# Patient Record
Sex: Male | Born: 1965 | Race: Black or African American | Hispanic: No | Smoking: Current some day smoker
Health system: Southern US, Community
[De-identification: ages and names within clinical notes are randomized; demographics above are authoritative.]

## PROBLEM LIST (undated history)

## (undated) DIAGNOSIS — T8859XA Other complications of anesthesia, initial encounter: Secondary | ICD-10-CM

## (undated) DIAGNOSIS — I10 Essential (primary) hypertension: Secondary | ICD-10-CM

## (undated) DIAGNOSIS — I5022 Chronic systolic (congestive) heart failure: Secondary | ICD-10-CM

## (undated) DIAGNOSIS — Z8719 Personal history of other diseases of the digestive system: Secondary | ICD-10-CM

## (undated) DIAGNOSIS — T4145XA Adverse effect of unspecified anesthetic, initial encounter: Secondary | ICD-10-CM

## (undated) DIAGNOSIS — I2510 Atherosclerotic heart disease of native coronary artery without angina pectoris: Secondary | ICD-10-CM

## (undated) DIAGNOSIS — K219 Gastro-esophageal reflux disease without esophagitis: Secondary | ICD-10-CM

## (undated) DIAGNOSIS — I428 Other cardiomyopathies: Secondary | ICD-10-CM

## (undated) HISTORY — PX: CHOLECYSTECTOMY: SHX55

## (undated) HISTORY — PX: OTHER SURGICAL HISTORY: SHX169

---

## 2014-03-22 ENCOUNTER — Telehealth: Payer: Self-pay | Admitting: Internal Medicine

## 2014-03-22 ENCOUNTER — Emergency Department (HOSPITAL_BASED_OUTPATIENT_CLINIC_OR_DEPARTMENT_OTHER)
Admission: EM | Admit: 2014-03-22 | Discharge: 2014-03-22 | Disposition: A | Discharge status: Home / Self Care | Payer: Non-veteran care | Attending: Emergency Medicine | Admitting: Emergency Medicine

## 2014-03-22 ENCOUNTER — Encounter (HOSPITAL_BASED_OUTPATIENT_CLINIC_OR_DEPARTMENT_OTHER): Payer: Self-pay | Admitting: Emergency Medicine

## 2014-03-22 ENCOUNTER — Emergency Department (HOSPITAL_BASED_OUTPATIENT_CLINIC_OR_DEPARTMENT_OTHER): Payer: Non-veteran care

## 2014-03-22 DIAGNOSIS — I509 Heart failure, unspecified: Secondary | ICD-10-CM

## 2014-03-22 DIAGNOSIS — Z79899 Other long term (current) drug therapy: Secondary | ICD-10-CM | POA: Insufficient documentation

## 2014-03-22 DIAGNOSIS — F17200 Nicotine dependence, unspecified, uncomplicated: Secondary | ICD-10-CM | POA: Insufficient documentation

## 2014-03-22 DIAGNOSIS — I10 Essential (primary) hypertension: Secondary | ICD-10-CM | POA: Insufficient documentation

## 2014-03-22 DIAGNOSIS — Z8701 Personal history of pneumonia (recurrent): Secondary | ICD-10-CM | POA: Insufficient documentation

## 2014-03-22 DIAGNOSIS — R42 Dizziness and giddiness: Secondary | ICD-10-CM | POA: Insufficient documentation

## 2014-03-22 DIAGNOSIS — R000 Tachycardia, unspecified: Secondary | ICD-10-CM | POA: Insufficient documentation

## 2014-03-22 HISTORY — DX: Essential (primary) hypertension: I10

## 2014-03-22 LAB — CBC
HCT: 44.4 % (ref 39.0–52.0)
HEMOGLOBIN: 15.3 g/dL (ref 13.0–17.0)
MCH: 29.9 pg (ref 26.0–34.0)
MCHC: 34.5 g/dL (ref 30.0–36.0)
MCV: 86.7 fL (ref 78.0–100.0)
Platelets: 212 10*3/uL (ref 150–400)
RBC: 5.12 MIL/uL (ref 4.22–5.81)
RDW: 12.9 % (ref 11.5–15.5)
WBC: 8.9 10*3/uL (ref 4.0–10.5)

## 2014-03-22 LAB — COMPREHENSIVE METABOLIC PANEL
ALK PHOS: 58 U/L (ref 39–117)
ALT: 79 U/L — ABNORMAL HIGH (ref 0–53)
AST: 44 U/L — ABNORMAL HIGH (ref 0–37)
Albumin: 3.6 g/dL (ref 3.5–5.2)
BUN: 17 mg/dL (ref 6–23)
CO2: 22 mEq/L (ref 19–32)
Calcium: 9 mg/dL (ref 8.4–10.5)
Chloride: 104 mEq/L (ref 96–112)
Creatinine, Ser: 1.2 mg/dL (ref 0.50–1.35)
GFR, EST AFRICAN AMERICAN: 82 mL/min — AB (ref >90)
GFR, EST NON AFRICAN AMERICAN: 70 mL/min — AB (ref >90)
GLUCOSE: 111 mg/dL — AB (ref 70–99)
POTASSIUM: 4.4 meq/L (ref 3.7–5.3)
SODIUM: 141 meq/L (ref 137–147)
Total Bilirubin: 0.6 mg/dL (ref 0.3–1.2)
Total Protein: 6.4 g/dL (ref 6.0–8.3)

## 2014-03-22 LAB — PRO B NATRIURETIC PEPTIDE: Pro B Natriuretic peptide (BNP): 3661 pg/mL — ABNORMAL HIGH (ref 0–125)

## 2014-03-22 LAB — TROPONIN I: Troponin I: <0.3 ng/mL (ref <0.30)

## 2014-03-22 MED ORDER — FUROSEMIDE 10 MG/ML IJ SOLN
40.0000 mg | Freq: Once | INTRAMUSCULAR | Status: AC
  Administered 2014-03-22: 40 mg via INTRAVENOUS
  Filled 2014-03-22: qty 4

## 2014-03-22 MED ORDER — LISINOPRIL 20 MG PO TABS: 10.0000 mg | ORAL_TABLET | Freq: Every day | ORAL | Status: DC

## 2014-03-22 MED ORDER — FUROSEMIDE 40 MG PO TABS: 40.0000 mg | ORAL_TABLET | Freq: Every day | ORAL | Status: DC

## 2014-03-22 NOTE — ED Notes (Signed)
MD at bedside discussing test results and giving detailed dispo plan of care.

## 2014-03-22 NOTE — Telephone Encounter (Signed)
I was asked to assist with appointment scheduling and initial medications for a By dr. Marena Chancy from [facility].  I have not personally seen the patient however the story is that this is a 48 year old gentleman with a past medical history of hypertension, on antihypertensive medications in the past however has not been taking them for a while. Now presented with new onset of lower extremity edema. His workup was notable for elevated proBNP at 3660 as well as pulmonary congestion on his chest x-ray with minimal bilateral pleural effusions. Patient vital signs were stable he was noted to be hypertensive. Patient wanted to go home and expressed wishes not to be admitted for inpatient workup. Given that he was not excessively short of breath and wanted to be discharged from the emergency department. We decided to start him on: -Lisinopril 10 mg daily by mouth -Lasix 40 mg daily he'll. He will also get Lasix 40 mg IV in the emergency department. No beta blockers because his EF is unknown and he is volume overloaded. I will ask our scheduling an apartment to call the patient to set up an appointment for new onset of heart failure.

## 2014-03-22 NOTE — ED Provider Notes (Signed)
CSN: [id number]     Arrival date & time 03/22/14  2019 History  This chart was scribed for Dagmar HaitWilliam Rigby Leonhardt, MD by Joaquin MusicKristina Sanchez-Matthews, ED Scribe. This patient was seen in room MH10/MH10 and the patient's care was started at 8:40 PM.   Chief Complaint  Patient presents with  . Shortness of Breath   Patient is a 48 y.o. male presenting with shortness of breath. The history is provided by the patient. No language interpreter was used.  Shortness of Breath Severity:  Moderate Onset quality:  Sudden Duration:  1 week Timing:  Constant Progression:  Unchanged Chronicity:  New Context: not activity and not known allergens   Relieved by:  Nothing Worsened by:  Nothing tried Ineffective treatments:  None tried Associated symptoms: no abdominal pain, no chest pain, no vomiting and no wheezing    HPI Comments: Mark Horton is a 48 y.o. male with hx of HTN who presents to the Emergency Department complaining of ongoing SOB x 1 week. He reports having a productive cough with thick sputum for "a long time". Pt states he has become SOB, winded, and frequent sweating with light-headedness and states he normally is very active. He states he can become SOB when walking to the car or other short distances. Hx of pneumonia in December 2014. Pt states he noticed swelling to bilateral legs yesterday evening and states he has been concerned due to progressively worsening swelling to lower extremities. He states he has been "feeling swelling coming up his bilateral things". He states he is generally seen at the [facility]. Pt denies nausea, emesis, CP, abd pain, and any recent illnesses.  Pt denies taking his HTN medications for the past month.  Past Medical History  Diagnosis Date  . Hypertension    Past Surgical History  Procedure Laterality Date  . Cholecystectomy    . Joint replacement     History reviewed. No pertinent family history. History  Substance Use Topics  . Smoking status: Current Every  Day Smoker -- 0.50 packs/day    Types: Cigarettes  . Smokeless tobacco: Not on file  . Alcohol Use: 1.2 oz/week    2 Cans of beer per week    Review of Systems  Respiratory: Positive for shortness of breath. Negative for wheezing.   Cardiovascular: Negative for chest pain.  Gastrointestinal: Negative for nausea, vomiting and abdominal pain.  Neurological: Positive for light-headedness.  All other systems reviewed and are negative.  Allergies  Review of patient's allergies indicates no known allergies.  Home Medications   Prior to Admission medications   Medication Sig Start Date End Date Taking? Authorizing Provider  metoprolol tartrate (LOPRESSOR) 25 MG tablet Take 25 mg by mouth 2 (two) times daily.   Yes Historical Provider, MD   Triage Vitals:BP 163/112  Pulse 115  Temp(Src) 98.3 F (36.8 C)  Resp 16  Ht 5\' 10"  (1.778 m)  Wt 215 lb (97.523 kg)  BMI 30.85 kg/m2  SpO2 100%  Physical Exam  Nursing note and vitals reviewed. Constitutional: He is oriented to person, place, and time. He appears well-developed and well-nourished. No distress.  HENT:  Head: Normocephalic and atraumatic.  Eyes: EOM are normal.  Neck: Neck supple. No tracheal deviation present.  Cardiovascular: Normal rate, regular rhythm and normal heart sounds.  Exam reveals no gallop and no friction rub.   No murmur heard. Pulmonary/Chest: Effort normal and breath sounds normal. No respiratory distress.  Abdominal: He exhibits no mass. There is no rebound and no  guarding.  Mild epigastric tenderness.  Musculoskeletal: Normal range of motion. He exhibits edema.  2+ pitting edema up to bilateral knees. No erythema or cellulitis.   Neurological: He is alert and oriented to person, place, and time.  Skin: Skin is warm and dry.  Psychiatric: He has a normal mood and affect. His behavior is normal.   ED Course  Procedures  DIAGNOSTIC STUDIES: Oxygen Saturation is 100% on RA, normal by my interpretation.     COORDINATION OF CARE: 8:45 PM-Discussed treatment plan which includes CXR, EKG, and labs. Pt agreed to plan.   Labs Review Labs Reviewed  COMPREHENSIVE METABOLIC PANEL - Abnormal; Notable for the following:    Glucose, Bld 111 (*)    AST 44 (*)    ALT 79 (*)    GFR calc non Af Amer 70 (*)    GFR calc Af Amer 82 (*)    All other components within normal limits  PRO B NATRIURETIC PEPTIDE - Abnormal; Notable for the following:    Pro B Natriuretic peptide (BNP) 3661.0 (*)    All other components within normal limits  CBC  TROPONIN I    Imaging Review Dg Chest 2 View  03/22/2014   CLINICAL DATA:  Chest pain, shortness of breath  EXAM: CHEST  2 VIEW  COMPARISON:  DG CHEST 2V dated 12/02/2013  FINDINGS: Bilateral diffuse interstitial thickening and peribronchial cuffing most concerning for bronchitis. There is no focal parenchymal opacity, pleural effusion, or pneumothorax. The heart and mediastinal contours are unremarkable.  The osseous structures are unremarkable.  IMPRESSION: Bilateral diffuse interstitial thickening and peribronchial cuffing most concerning for bronchitis.   Electronically Signed   By: Elige Ko   On: 03/22/2014 21:18     EKG Interpretation None     MDM   Final diagnoses:  Heart failure    43M presents with symptoms of increased leg swelling, dyspnea on exertion, cough. No fevers. On exam, vitals with stable BP, mild tachycardia. On my exam, HR in the 90s. Relaxing comfortably, no respiratory distress. No JVD, no SOB. No rales. Belly benign. 2+ pitting edema in bilateral lower extremities. Labs show new onset heart failure with elevated BNP. Patient given Lasix IV. Patient doesn't want to be admitted. With stable vitals, this is reasonable. I spoke with the Cards fellow who recommended Lasix, lisinopril. Cards Fellow will put patient on the list to arrange f/u tomorrow morning. Patient given their number if he is not contacted. Patient aware of diagnosis,  understands he needs to take meds. Stable for discharge.  I personally performed the services described in this documentation, which was scribed in my presence. The recorded information has been reviewed and is accurate.  Dagmar Hait, MD 03/22/14 2239

## 2014-03-22 NOTE — Discharge Instructions (Signed)
Cardiology with call you with a follow-up appointment. Please follow up with them as soon as possible. Please take Lasix 40 mg once a day and Lisinopril 10 mg once a day for your heart failure.  Heart Failure Heart failure is a condition in which the heart has trouble pumping blood. This means your heart does not pump blood efficiently for your body to work well. In some cases of heart failure, fluid may back up into your lungs or you may have swelling (edema) in your lower legs. Heart failure is usually a long-term (chronic) condition. It is important for you to take good care of yourself and follow your caregiver's treatment plan. CAUSES  Some health conditions can cause heart failure. Those health conditions include:  High blood pressure (hypertension) causes the heart muscle to work harder than normal. When pressure in the blood vessels is high, the heart needs to pump (contract) with more force in order to circulate blood throughout the body. High blood pressure eventually causes the heart to become stiff and weak.  Coronary artery disease (CAD) is the buildup of cholesterol and fat (plaque) in the arteries of the heart. The blockage in the arteries deprives the heart muscle of oxygen and blood. This can cause chest pain and may lead to a heart attack. High blood pressure can also contribute to CAD.  Heart attack (myocardial infarction) occurs when 1 or more arteries in the heart become blocked. The loss of oxygen damages the muscle tissue of the heart. When this happens, part of the heart muscle dies. The injured tissue does not contract as well and weakens the heart's ability to pump blood.  Abnormal heart valves can cause heart failure when the heart valves do not open and close properly. This makes the heart muscle pump harder to keep the blood flowing.  Heart muscle disease (cardiomyopathy or myocarditis) is damage to the heart muscle from a variety of causes. These can include drug or  alcohol abuse, infections, or unknown reasons. These can increase the risk of heart failure.  Lung disease makes the heart work harder because the lungs do not work properly. This can cause a strain on the heart, leading it to fail.  Diabetes increases the risk of heart failure. High blood sugar contributes to high fat (lipid) levels in the blood. Diabetes can also cause slow damage to tiny blood vessels that carry important nutrients to the heart muscle. When the heart does not get enough oxygen and food, it can cause the heart to become weak and stiff. This leads to a heart that does not contract efficiently.  Other conditions can contribute to heart failure. These include abnormal heart rhythms, thyroid problems, and low blood counts (anemia). Certain unhealthy behaviors can increase the risk of heart failure. Those unhealthy behaviors include:  Being overweight.  Smoking or chewing tobacco.  Eating foods high in fat and cholesterol.  Abusing illicit drugs or alcohol.  Lacking physical activity. SYMPTOMS  Heart failure symptoms may vary and can be hard to detect. Symptoms may include:  Shortness of breath with activity, such as climbing stairs.  Persistent cough.  Swelling of the feet, ankles, legs, or abdomen.  Unexplained weight gain.  Difficulty breathing when lying flat (orthopnea).  Waking from sleep because of the need to sit up and get more air.  Rapid heartbeat.  Fatigue and loss of energy.  Feeling lightheaded, dizzy, or close to fainting.  Loss of appetite.  Nausea.  Increased urination during the night (nocturia).  DIAGNOSIS  A diagnosis of heart failure is based on your history, symptoms, physical examination, and diagnostic tests. Diagnostic tests for heart failure may include:  Echocardiography.  Electrocardiography.  Chest X-ray.  Blood tests.  Exercise stress test.  Cardiac angiography.  Radionuclide scans. TREATMENT  Treatment is aimed  at managing the symptoms of heart failure. Medicines, behavioral changes, or surgical intervention may be necessary to treat heart failure.  Medicines to help treat heart failure may include:  Angiotensin-converting enzyme (ACE) inhibitors. This type of medicine blocks the effects of a blood protein called angiotensin-converting enzyme. ACE inhibitors relax (dilate) the blood vessels and help lower blood pressure.  Angiotensin receptor blockers. This type of medicine blocks the actions of a blood protein called angiotensin. Angiotensin receptor blockers dilate the blood vessels and help lower blood pressure.  Water pills (diuretics). Diuretics cause the kidneys to remove salt and water from the blood. The extra fluid is removed through urination. This loss of extra fluid lowers the volume of blood the heart pumps.  Beta blockers. These prevent the heart from beating too fast and improve heart muscle strength.  Digitalis. This increases the force of the heartbeat.  Healthy behavior changes include:  Obtaining and maintaining a healthy weight.  Stopping smoking or chewing tobacco.  Eating heart healthy foods.  Limiting or avoiding alcohol.  Stopping illicit drug use.  Physical activity as directed by your caregiver.  Surgical treatment for heart failure may include:  A procedure to open blocked arteries, repair damaged heart valves, or remove damaged heart muscle tissue.  A pacemaker to improve heart muscle function and control certain abnormal heart rhythms.  An internal cardioverter defibrillator to treat certain serious abnormal heart rhythms.  A left ventricular assist device to assist the pumping ability of the heart. HOME CARE INSTRUCTIONS   Take your medicine as directed by your caregiver. Medicines are important in reducing the workload of your heart, slowing the progression of heart failure, and improving your symptoms.  Do not stop taking your medicine unless directed  by your caregiver.  Do not skip any dose of medicine.  Refill your prescriptions before you run out of medicine. Your medicines are needed every day.  Take over-the-counter medicine only as directed by your caregiver or pharmacist.  Engage in moderate physical activity if directed by your caregiver. Moderate physical activity can benefit some people. The elderly and people with severe heart failure should consult with a caregiver for physical activity recommendations.  Eat heart healthy foods. Food choices should be free of trans fat and low in saturated fat, cholesterol, and salt (sodium). Healthy choices include fresh or frozen fruits and vegetables, fish, lean meats, legumes, fat-free or low-fat dairy products, and whole grain or high fiber foods. Talk to a dietitian to learn more about heart healthy foods.  Limit sodium if directed by your caregiver. Sodium restriction may reduce symptoms of heart failure in some people. Talk to a dietitian to learn more about heart healthy seasonings.  Use healthy cooking methods. Healthy cooking methods include roasting, grilling, broiling, baking, poaching, steaming, or stir-frying. Talk to a dietitian to learn more about healthy cooking methods.  Limit fluids if directed by your caregiver. Fluid restriction may reduce symptoms of heart failure in some people.  Weigh yourself every day. Daily weights are important in the early recognition of excess fluid. You should weigh yourself every morning after you urinate and before you eat breakfast. Wear the same amount of clothing each time you weigh yourself.  Record your daily weight. Provide your caregiver with your weight record.  Monitor and record your blood pressure if directed by your caregiver.  Check your pulse if directed by your caregiver.  Lose weight if directed by your caregiver. Weight loss may reduce symptoms of heart failure in some people.  Stop smoking or chewing tobacco. Nicotine makes  your heart work harder by causing your blood vessels to constrict. Do not use nicotine gum or patches before talking to your caregiver.  Schedule and attend follow-up visits as directed by your caregiver. It is important to keep all your appointments.  Limit alcohol intake to no more than 1 drink per day for nonpregnant women and 2 drinks per day for men. Drinking more than that is harmful to your heart. Tell your caregiver if you drink alcohol several times a week. Talk with your caregiver about whether alcohol is safe for you. If your heart has already been damaged by alcohol or you have severe heart failure, drinking alcohol should be stopped completely.  Stop illicit drug use.  Stay up-to-date with immunizations. It is especially important to prevent respiratory infections through current pneumococcal and influenza immunizations.  Manage other health conditions such as hypertension, diabetes, thyroid disease, or abnormal heart rhythms as directed by your caregiver.  Learn to manage stress.  Plan rest periods when fatigued.  Learn strategies to manage high temperatures. If the weather is extremely hot:  Avoid vigorous physical activity.  Use air conditioning or fans or seek a cooler location.  Avoid caffeine and alcohol.  Wear loose-fitting, lightweight, and light-colored clothing.  Learn strategies to manage cold temperatures. If the weather is extremely cold:  Avoid vigorous physical activity.  Layer clothes.  Wear mittens or gloves, a hat, and a scarf when going outside.  Avoid alcohol.  Obtain ongoing education and support as needed.  Participate or seek rehabilitation as needed to maintain or improve independence and quality of life. SEEK MEDICAL CARE IF:   Your weight increases by 03 lb/1.4 kg in 1 day or 05 lb/2.3 kg in a week.  You have increasing shortness of breath that is unusual for you.  You are unable to participate in your usual physical  activities.  You tire easily.  You cough more than normal, especially with physical activity.  You have any or more swelling in areas such as your hands, feet, ankles, or abdomen.  You are unable to sleep because it is hard to breathe.  You feel like your heart is beating fast (palpitations).  You become dizzy or lightheaded upon standing up. SEEK IMMEDIATE MEDICAL CARE IF:   You have difficulty breathing.  There is a change in mental status such as decreased alertness or difficulty with concentration.  You have a pain or discomfort in your chest.  You have an episode of fainting (syncope). MAKE SURE YOU:   Understand these instructions.  Will watch your condition.  Will get help right away if you are not doing well or get worse. Document Released: 11/23/2005 Document Revised: 03/20/2013 Document Reviewed: 12/15/2012 [facility] Patient Information 2014 [place], [place].

## 2014-03-22 NOTE — ED Notes (Signed)
Pt c/o lower extr swelling and SOB x 1 week  W/o BP med x 1 month

## 2014-03-26 ENCOUNTER — Inpatient Hospital Stay (HOSPITAL_COMMUNITY)
Admission: AD | Admit: 2014-03-26 | Discharge: 2014-03-31 | DRG: 287 | Disposition: A | Discharge status: Home / Self Care | Payer: Non-veteran care | Source: Ambulatory Visit | Attending: Cardiology | Admitting: Cardiology

## 2014-03-26 ENCOUNTER — Encounter: Payer: Self-pay | Admitting: Cardiology

## 2014-03-26 ENCOUNTER — Ambulatory Visit (INDEPENDENT_AMBULATORY_CARE_PROVIDER_SITE_OTHER): Payer: Non-veteran care | Admitting: Cardiology

## 2014-03-26 ENCOUNTER — Encounter (HOSPITAL_COMMUNITY): Payer: Self-pay | Admitting: General Practice

## 2014-03-26 ENCOUNTER — Inpatient Hospital Stay (HOSPITAL_COMMUNITY): Payer: Non-veteran care

## 2014-03-26 VITALS — BP 138/104 | HR 103 | Ht 70.0 in | Wt 204.8 lb

## 2014-03-26 DIAGNOSIS — I472 Ventricular tachycardia, unspecified: Secondary | ICD-10-CM | POA: Diagnosis not present

## 2014-03-26 DIAGNOSIS — I2589 Other forms of chronic ischemic heart disease: Secondary | ICD-10-CM | POA: Diagnosis present

## 2014-03-26 DIAGNOSIS — I5022 Chronic systolic (congestive) heart failure: Secondary | ICD-10-CM | POA: Diagnosis present

## 2014-03-26 DIAGNOSIS — I5021 Acute systolic (congestive) heart failure: Secondary | ICD-10-CM

## 2014-03-26 DIAGNOSIS — Z8249 Family history of ischemic heart disease and other diseases of the circulatory system: Secondary | ICD-10-CM

## 2014-03-26 DIAGNOSIS — F17200 Nicotine dependence, unspecified, uncomplicated: Secondary | ICD-10-CM

## 2014-03-26 DIAGNOSIS — I110 Hypertensive heart disease with heart failure: Principal | ICD-10-CM | POA: Diagnosis present

## 2014-03-26 DIAGNOSIS — I119 Hypertensive heart disease without heart failure: Secondary | ICD-10-CM

## 2014-03-26 DIAGNOSIS — F1210 Cannabis abuse, uncomplicated: Secondary | ICD-10-CM | POA: Diagnosis present

## 2014-03-26 DIAGNOSIS — I509 Heart failure, unspecified: Secondary | ICD-10-CM

## 2014-03-26 DIAGNOSIS — Z720 Tobacco use: Secondary | ICD-10-CM | POA: Insufficient documentation

## 2014-03-26 DIAGNOSIS — I4729 Other ventricular tachycardia: Secondary | ICD-10-CM | POA: Diagnosis not present

## 2014-03-26 DIAGNOSIS — I059 Rheumatic mitral valve disease, unspecified: Secondary | ICD-10-CM

## 2014-03-26 DIAGNOSIS — I428 Other cardiomyopathies: Secondary | ICD-10-CM

## 2014-03-26 DIAGNOSIS — I10 Essential (primary) hypertension: Secondary | ICD-10-CM | POA: Insufficient documentation

## 2014-03-26 HISTORY — DX: Adverse effect of unspecified anesthetic, initial encounter: T41.45XA

## 2014-03-26 HISTORY — DX: Personal history of other diseases of the digestive system: Z87.19

## 2014-03-26 HISTORY — DX: Gastro-esophageal reflux disease without esophagitis: K21.9

## 2014-03-26 HISTORY — DX: Atherosclerotic heart disease of native coronary artery without angina pectoris: I25.10

## 2014-03-26 HISTORY — DX: Other complications of anesthesia, initial encounter: T88.59XA

## 2014-03-26 HISTORY — DX: Chronic systolic (congestive) heart failure: I50.22

## 2014-03-26 HISTORY — DX: Other cardiomyopathies: I42.8

## 2014-03-26 LAB — COMPREHENSIVE METABOLIC PANEL
ALT: 116 U/L — ABNORMAL HIGH (ref 0–53)
AST: 60 U/L — ABNORMAL HIGH (ref 0–37)
Albumin: 3.1 g/dL — ABNORMAL LOW (ref 3.5–5.2)
Alkaline Phosphatase: 89 U/L (ref 39–117)
BILIRUBIN TOTAL: 0.8 mg/dL (ref 0.3–1.2)
BUN: 18 mg/dL (ref 6–23)
CHLORIDE: 106 meq/L (ref 96–112)
CO2: 22 meq/L (ref 19–32)
CREATININE: 1.35 mg/dL (ref 0.50–1.35)
Calcium: 9 mg/dL (ref 8.4–10.5)
GFR calc Af Amer: 71 mL/min — ABNORMAL LOW (ref >90)
GFR, EST NON AFRICAN AMERICAN: 61 mL/min — AB (ref >90)
Glucose, Bld: 132 mg/dL — ABNORMAL HIGH (ref 70–99)
Potassium: 4.6 mEq/L (ref 3.7–5.3)
Sodium: 142 mEq/L (ref 137–147)
Total Protein: 6.3 g/dL (ref 6.0–8.3)

## 2014-03-26 LAB — CBC WITH DIFFERENTIAL/PLATELET
Basophils Absolute: 0 10*3/uL (ref 0.0–0.1)
Basophils Relative: 1 % (ref 0–1)
Eosinophils Absolute: 0.1 10*3/uL (ref 0.0–0.7)
Eosinophils Relative: 1 % (ref 0–5)
HCT: 46.2 % (ref 39.0–52.0)
Hemoglobin: 15.5 g/dL (ref 13.0–17.0)
LYMPHS ABS: 1.9 10*3/uL (ref 0.7–4.0)
Lymphocytes Relative: 25 % (ref 12–46)
MCH: 29.5 pg (ref 26.0–34.0)
MCHC: 33.5 g/dL (ref 30.0–36.0)
MCV: 88 fL (ref 78.0–100.0)
Monocytes Absolute: 0.5 10*3/uL (ref 0.1–1.0)
Monocytes Relative: 7 % (ref 3–12)
NEUTROS PCT: 66 % (ref 43–77)
Neutro Abs: 5 10*3/uL (ref 1.7–7.7)
Platelets: 227 10*3/uL (ref 150–400)
RBC: 5.25 MIL/uL (ref 4.22–5.81)
RDW: 13.1 % (ref 11.5–15.5)
WBC: 7.5 10*3/uL (ref 4.0–10.5)

## 2014-03-26 LAB — PROTIME-INR
INR: 1.3 (ref 0.00–1.49)
PROTHROMBIN TIME: 15.9 s — AB (ref 11.6–15.2)

## 2014-03-26 LAB — TROPONIN I: Troponin I: <0.3 ng/mL (ref <0.30)

## 2014-03-26 LAB — TSH: TSH: 2.71 u[IU]/mL (ref 0.350–4.500)

## 2014-03-26 LAB — PRO B NATRIURETIC PEPTIDE: Pro B Natriuretic peptide (BNP): 3960 pg/mL — ABNORMAL HIGH (ref 0–125)

## 2014-03-26 MED ORDER — LISINOPRIL 20 MG PO TABS
20.0000 mg | ORAL_TABLET | Freq: Every day | ORAL | Status: DC
  Administered 2014-03-26 – 2014-03-27 (×2): 20 mg via ORAL
  Filled 2014-03-26 (×2): qty 1

## 2014-03-26 MED ORDER — FUROSEMIDE 10 MG/ML IJ SOLN
40.0000 mg | Freq: Two times a day (BID) | INTRAMUSCULAR | Status: DC
Start: 2014-03-26 — End: 2014-03-28
  Administered 2014-03-26 – 2014-03-28 (×4): 40 mg via INTRAVENOUS
  Filled 2014-03-26 (×7): qty 4

## 2014-03-26 MED ORDER — SODIUM CHLORIDE 0.9 % IV SOLN: 250.0000 mL | INTRAVENOUS | Status: DC | PRN

## 2014-03-26 MED ORDER — ASPIRIN EC 81 MG PO TBEC
81.0000 mg | DELAYED_RELEASE_TABLET | Freq: Every day | ORAL | Status: DC
  Administered 2014-03-26 – 2014-03-31 (×5): 81 mg via ORAL
  Filled 2014-03-26 (×6): qty 1

## 2014-03-26 MED ORDER — FUROSEMIDE 10 MG/ML IJ SOLN
40.0000 mg | Freq: Once | INTRAMUSCULAR | Status: AC
  Administered 2014-03-26: 40 mg via INTRAVENOUS
  Filled 2014-03-26: qty 4

## 2014-03-26 MED ORDER — VITAMIN B-1 100 MG PO TABS
100.0000 mg | ORAL_TABLET | Freq: Every day | ORAL | Status: DC
  Administered 2014-03-26 – 2014-03-31 (×6): 100 mg via ORAL
  Filled 2014-03-26 (×6): qty 1

## 2014-03-26 MED ORDER — HYDRALAZINE HCL 20 MG/ML IJ SOLN
10.0000 mg | Freq: Four times a day (QID) | INTRAMUSCULAR | Status: DC | PRN
  Filled 2014-03-26: qty 1

## 2014-03-26 MED ORDER — FOLIC ACID 1 MG PO TABS
1.0000 mg | ORAL_TABLET | Freq: Every day | ORAL | Status: DC
  Administered 2014-03-26 – 2014-03-31 (×6): 1 mg via ORAL
  Filled 2014-03-26 (×6): qty 1

## 2014-03-26 MED ORDER — METOPROLOL TARTRATE 25 MG PO TABS
25.0000 mg | ORAL_TABLET | Freq: Two times a day (BID) | ORAL | Status: DC
  Administered 2014-03-26 – 2014-03-27 (×2): 25 mg via ORAL
  Filled 2014-03-26 (×3): qty 1

## 2014-03-26 MED ORDER — SODIUM CHLORIDE 0.9 % IJ SOLN
3.0000 mL | Freq: Two times a day (BID) | INTRAMUSCULAR | Status: DC
  Administered 2014-03-26 – 2014-03-30 (×8): 3 mL via INTRAVENOUS

## 2014-03-26 MED ORDER — LORAZEPAM 2 MG/ML IJ SOLN: 1.0000 mg | Freq: Four times a day (QID) | INTRAMUSCULAR | Status: AC | PRN

## 2014-03-26 MED ORDER — ACETAMINOPHEN 325 MG PO TABS
650.0000 mg | ORAL_TABLET | ORAL | Status: DC | PRN
  Administered 2014-03-26 – 2014-03-30 (×2): 650 mg via ORAL
  Filled 2014-03-26 (×2): qty 2

## 2014-03-26 MED ORDER — ONDANSETRON HCL 4 MG/2ML IJ SOLN
4.0000 mg | Freq: Four times a day (QID) | INTRAMUSCULAR | Status: DC | PRN
  Administered 2014-03-26 – 2014-03-30 (×9): 4 mg via INTRAVENOUS
  Filled 2014-03-26 (×9): qty 2

## 2014-03-26 MED ORDER — LORAZEPAM 1 MG PO TABS: 0.0000 mg | ORAL_TABLET | Freq: Four times a day (QID) | ORAL | Status: AC

## 2014-03-26 MED ORDER — SODIUM CHLORIDE 0.9 % IJ SOLN: 3.0000 mL | INTRAMUSCULAR | Status: DC | PRN

## 2014-03-26 MED ORDER — HEPARIN SODIUM (PORCINE) 5000 UNIT/ML IJ SOLN
5000.0000 [IU] | Freq: Three times a day (TID) | INTRAMUSCULAR | Status: DC
  Administered 2014-03-26 – 2014-03-31 (×13): 5000 [IU] via SUBCUTANEOUS
  Filled 2014-03-26 (×18): qty 1

## 2014-03-26 MED ORDER — LORAZEPAM 1 MG PO TABS: 0.0000 mg | ORAL_TABLET | Freq: Two times a day (BID) | ORAL | Status: AC

## 2014-03-26 MED ORDER — ADULT MULTIVITAMIN W/MINERALS CH
1.0000 | ORAL_TABLET | Freq: Every day | ORAL | Status: DC
  Administered 2014-03-26 – 2014-03-31 (×6): 1 via ORAL
  Filled 2014-03-26 (×6): qty 1

## 2014-03-26 MED ORDER — LORAZEPAM 1 MG PO TABS: 1.0000 mg | ORAL_TABLET | Freq: Four times a day (QID) | ORAL | Status: AC | PRN

## 2014-03-26 MED ORDER — THIAMINE HCL 100 MG/ML IJ SOLN
100.0000 mg | Freq: Every day | INTRAMUSCULAR | Status: DC
  Filled 2014-03-26 (×5): qty 1

## 2014-03-26 NOTE — Assessment & Plan Note (Signed)
Patient presents with acute congestive heart failure. His LV function is unknown. He is significantly volume overloaded and significantly symptomatic. Plan to admit. Begin IV diuresis with Lasix 40 mg IV twice a day. Follow renal function. Check echocardiogram for LV function. If LV function reduced he will need the addition of a beta blocker. Etiology of cardiomyopathy unclear but most likely hypertensive. He consumes 3 beers approximately 5 days per week. I have asked him to reduce this. Check TSH. He also describes chest pressure. If LV function is reduced he will need cardiac catheterization to exclude coronary disease. The risks and benefits were discussed and he agrees to proceed. Would plan after CHF improves.

## 2014-03-26 NOTE — Assessment & Plan Note (Signed)
Patient counseled on discontinuing. 

## 2014-03-26 NOTE — Assessment & Plan Note (Signed)
Blood pressure is elevated. Increase lisinopril to 20 mg daily. Diurese as outlined under congestive heart failure. Further titration of medications based on followup blood pressure.

## 2014-03-26 NOTE — H&P (Signed)
Mark Horton  03/26/2014 11:30 AM   Office Visit  MRN:  [id number]   Description: 48 year old male  Provider: Lewayne Bunting, MD  Department: [address] [facility]         Diagnoses    Acute systolic congestive heart failure    -  Primary    428.21,  428.0    Tobacco abuse        305.1    Hypertension        401.9      Reason for Visit    Congestive Heart Failure    New patient evaluation         Progress Notes    Lewayne Bunting, MD at 03/26/2014  8:30 AM    Status: Signed                   HPI: 48 year old male for evaluation of congestive heart failure. Seen recently in the emergency room with symptoms of CHF. Chest x-ray showed bilateral diffuse interstitial thickening and peribronchial cuffing most concerning for bronchitis. Hemoglobin 15.3. BUN 17 and creatinine 1.2. SGOT 44 SGPT 79. Pro BNP 3661. Patient was given IV Lasix. He declined to be admitted. He was asked to followup with cardiology. Patient states that he has had progressive dyspnea on exertion since December. There is orthopnea. He has developed bilateral lower extremity edema over the past one week. He has occasional chest pressure not related to exertion without radiation. He has had a nonproductive cough as well. No fevers, chills but there has been diaphoresis. Note he has hypertension for several years but has not taken his medications.    Current Outpatient Prescriptions   Medication  Sig  Dispense  Refill   .  furosemide (LASIX) 40 MG tablet  Take 1 tablet (40 mg total) by mouth daily.   30 tablet   0   .  lisinopril (PRINIVIL,ZESTRIL) 20 MG tablet  Take 0.5 tablets (10 mg total) by mouth daily.   30 tablet   0       No current facility-administered medications for this visit.        No Known Allergies  Past Medical History   Diagnosis  Date   .  Hypertension           Past Surgical History   Procedure  Laterality  Date   .  Cholecystectomy       .  Left knee surgery              History       Social History   .  Marital Status:  Legally Separated       Spouse Name:  N/A       Number of Children:  1   .  Years of Education:  N/A       Occupational History   .           Holiday representative       Social History Main Topics   .  Smoking status:  Current Every Day Smoker -- 0.50 packs/day       Types:  Cigarettes   .  Smokeless tobacco:  Not on file   .  Alcohol Use:  1.2 oz/week       2 Cans of beer per week   .  Drug Use:  Yes       Special:  Marijuana   .  Sexual Activity:  Not on file  Other Topics  Concern   .  Not on file       Social History Narrative   .  No narrative on file         Family History   Problem  Relation  Age of Onset   .  Heart disease  Father         Stent and CHF        ROS: Nonproductive cough And diaphoresis but no fevers or chills, hemoptysis, dysphasia, odynophagia, melena, hematochezia, dysuria, hematuria, rash, seizure activity, claudication. Remaining systems are negative.   Physical Exam:    Blood pressure 138/104, pulse 103, height 5\' 10"  (1.778 m), weight 204 lb 12.8 oz (92.897 kg), SpO2 97.00%.   General:  Well developed/well nourished in Mild distress. Diaphoretic. Skin warm/dry Patient not depressed No peripheral clubbing Back-normal HEENT-normal/normal eyelids Neck supple/normal carotid upstroke bilaterally; no bruits; no JVD; no thyromegaly chest - CTA/ normal expansion CV - RRR/normal S1 and S2; no murmurs;  Positive S4. Abdomen -NT/ND, no HSM, no mass, + bowel sounds, no bruit 2+ femoral pulses, no bruits Ext-2-3+ bilateral lower extremity edema, no chords, 2+ DP Neuro-grossly nonfocal   ECG Sinus rhythm at a rate of 103. Left atrial enlargement. Left ventricular hypertrophy. Inferior lateral T-wave inversion.              Acute systolic congestive heart failure - Lewayne BuntingBrian S Levonte Molina, MD at 03/26/2014 12:00 PM    Status: Written Related Problem: Acute systolic congestive  heart failure    Patient presents with acute congestive heart failure. His LV function is unknown. He is significantly volume overloaded and significantly symptomatic. Plan to admit. Begin IV diuresis with Lasix 40 mg IV twice a day. Follow renal function. Check echocardiogram for LV function. If LV function reduced he will need the addition of a beta blocker. Etiology of cardiomyopathy unclear but most likely hypertensive. He consumes 3 beers approximately 5 days per week. I have asked him to reduce this. Check TSH. He also describes chest pressure. If LV function is reduced he will need cardiac catheterization to exclude coronary disease. The risks and benefits were discussed and he agrees to proceed. Would plan after CHF improves.         Hypertension - Lewayne BuntingBrian S Elma Shands, MD at 03/26/2014 12:00 PM    Status: Written Related Problem: Hypertension    Blood pressure is elevated. Increase lisinopril to 20 mg daily. Diurese as outlined under congestive heart failure. Further titration of medications based on followup blood pressure.         Tobacco abuse - Lewayne BuntingBrian S Nekeya Briski, MD at 03/26/2014 12:00 PM    Status: Written Related Problem: Tobacco abuse    Patient counseled on discontinuing.

## 2014-03-26 NOTE — Progress Notes (Signed)
     HPI: 48 year old male for evaluation of congestive heart failure. Seen recently in the emergency room with symptoms of CHF. Chest x-ray showed bilateral diffuse interstitial thickening and peribronchial cuffing most concerning for bronchitis. Hemoglobin 15.3. BUN 17 and creatinine 1.2. SGOT 44 SGPT 79. Pro BNP 3661. Patient was given IV Lasix. He declined to be admitted. He was asked to followup with cardiology. Patient states that he has had progressive dyspnea on exertion since December. There is orthopnea. He has developed bilateral lower extremity edema over the past one week. He has occasional chest pressure not related to exertion without radiation. He has had a nonproductive cough as well. No fevers, chills but there has been diaphoresis. Note he has hypertension for several years but has not taken his medications.  Current Outpatient Prescriptions  Medication Sig Dispense Refill  . furosemide (LASIX) 40 MG tablet Take 1 tablet (40 mg total) by mouth daily.  30 tablet  0  . lisinopril (PRINIVIL,ZESTRIL) 20 MG tablet Take 0.5 tablets (10 mg total) by mouth daily.  30 tablet  0   No current facility-administered medications for this visit.    No Known Allergies  Past Medical History  Diagnosis Date  . Hypertension     Past Surgical History  Procedure Laterality Date  . Cholecystectomy    . Left knee surgery      History   Social History  . Marital Status: Legally Separated    Spouse Name: N/A    Number of Children: 1  . Years of Education: N/A   Occupational History  .      Holiday representative   Social History Main Topics  . Smoking status: Current Every Day Smoker -- 0.50 packs/day    Types: Cigarettes  . Smokeless tobacco: Not on file  . Alcohol Use: 1.2 oz/week    2 Cans of beer per week  . Drug Use: Yes    Special: Marijuana  . Sexual Activity: Not on file   Other Topics Concern  . Not on file   Social History Narrative  . No narrative on file    Family  History  Problem Relation Age of Onset  . Heart disease Father     Stent and CHF    ROS: Nonproductive cough And diaphoresis but no fevers or chills, hemoptysis, dysphasia, odynophagia, melena, hematochezia, dysuria, hematuria, rash, seizure activity, claudication. Remaining systems are negative.  Physical Exam:   Blood pressure 138/104, pulse 103, height 5\' 10"  (1.778 m), weight 204 lb 12.8 oz (92.897 kg), SpO2 97.00%.  General:  Well developed/well nourished in Mild distress. Diaphoretic. Skin warm/dry Patient not depressed No peripheral clubbing Back-normal HEENT-normal/normal eyelids Neck supple/normal carotid upstroke bilaterally; no bruits; no JVD; no thyromegaly chest - CTA/ normal expansion CV - RRR/normal S1 and S2; no murmurs;  Positive S4. Abdomen -NT/ND, no HSM, no mass, + bowel sounds, no bruit 2+ femoral pulses, no bruits Ext-2-3+ bilateral lower extremity edema, no chords, 2+ DP Neuro-grossly nonfocal  ECG Sinus rhythm at a rate of 103. Left atrial enlargement. Left ventricular hypertrophy. Inferior lateral T-wave inversion.

## 2014-03-26 NOTE — Progress Notes (Signed)
Pt with 9 beat run of VT; pt states he did feel some palpations that last for about 10-15 seconds; BP 149/117, HR ST 101; Luke Kilroy Pa paged and made aware; awaiting new orders to be received

## 2014-03-27 DIAGNOSIS — F17200 Nicotine dependence, unspecified, uncomplicated: Secondary | ICD-10-CM

## 2014-03-27 LAB — LIPID PANEL
Cholesterol: 111 mg/dL (ref 0–200)
HDL: 39 mg/dL — ABNORMAL LOW (ref >39)
LDL CALC: 56 mg/dL (ref 0–99)
TRIGLYCERIDES: 81 mg/dL (ref <150)
Total CHOL/HDL Ratio: 2.8 RATIO
VLDL: 16 mg/dL (ref 0–40)

## 2014-03-27 LAB — BASIC METABOLIC PANEL
BUN: 20 mg/dL (ref 6–23)
CO2: 24 mEq/L (ref 19–32)
Calcium: 8.5 mg/dL (ref 8.4–10.5)
Chloride: 102 mEq/L (ref 96–112)
Creatinine, Ser: 1.35 mg/dL (ref 0.50–1.35)
GFR calc Af Amer: 71 mL/min — ABNORMAL LOW (ref >90)
GFR, EST NON AFRICAN AMERICAN: 61 mL/min — AB (ref >90)
GLUCOSE: 106 mg/dL — AB (ref 70–99)
POTASSIUM: 3.9 meq/L (ref 3.7–5.3)
Sodium: 140 mEq/L (ref 137–147)

## 2014-03-27 LAB — TROPONIN I: Troponin I: <0.3 ng/mL (ref <0.30)

## 2014-03-27 LAB — MAGNESIUM: Magnesium: 2.3 mg/dL (ref 1.5–2.5)

## 2014-03-27 MED ORDER — LISINOPRIL 40 MG PO TABS
40.0000 mg | ORAL_TABLET | Freq: Every day | ORAL | Status: DC
  Administered 2014-03-28 – 2014-03-31 (×3): 40 mg via ORAL
  Filled 2014-03-27 (×4): qty 1

## 2014-03-27 MED ORDER — CARVEDILOL 6.25 MG PO TABS
6.2500 mg | ORAL_TABLET | Freq: Two times a day (BID) | ORAL | Status: DC
  Administered 2014-03-27 – 2014-03-31 (×8): 6.25 mg via ORAL
  Filled 2014-03-27 (×10): qty 1

## 2014-03-27 MED ORDER — ZOLPIDEM TARTRATE 5 MG PO TABS
5.0000 mg | ORAL_TABLET | Freq: Every evening | ORAL | Status: DC | PRN
  Administered 2014-03-27 – 2014-03-31 (×2): 5 mg via ORAL
  Filled 2014-03-27 (×2): qty 1

## 2014-03-27 MED ORDER — PANTOPRAZOLE SODIUM 40 MG PO TBEC
40.0000 mg | DELAYED_RELEASE_TABLET | Freq: Every day | ORAL | Status: DC
  Administered 2014-03-28 – 2014-03-31 (×4): 40 mg via ORAL
  Filled 2014-03-27 (×4): qty 1

## 2014-03-27 NOTE — Clinical Documentation Improvement (Signed)
  H&P states "Etiology of cardiomyopathy unclear but most likely hypertensive". Please clarify in Notes if  "Hypertensive Heart Disease" would be an appropriate secondary diagnosis. Thank you.   Thank You, Beverley Fiedler ,RN Clinical Documentation Specialist:  [phone]  [facility]- Health Information Management

## 2014-03-27 NOTE — Progress Notes (Signed)
Utilization review completed.  

## 2014-03-27 NOTE — Progress Notes (Signed)
Patient Name: Mark Horton Date of Encounter: 03/27/2014  Active Problems:   Acute systolic CHF (congestive heart failure)   Length of Stay: 1  SUBJECTIVE  Still orthopneic, dyspneic washing up No chest pain Good UO BP very high Hates metoprolol Echo suggests regional wall motion abnormalities and EF is moderately depressed Brief NSVT  CURRENT MEDS . aspirin EC  81 mg Oral Daily  . folic acid  1 mg Oral Daily  . furosemide  40 mg Intravenous BID  . heparin  5,000 Units Subcutaneous 3 times per day  . lisinopril  20 mg Oral Daily  . LORazepam  0-4 mg Oral Q6H   Followed by  . [START ON 03/28/2014] LORazepam  0-4 mg Oral Q12H  . metoprolol tartrate  25 mg Oral BID  . multivitamin with minerals  1 tablet Oral Daily  . sodium chloride  3 mL Intravenous Q12H  . thiamine  100 mg Oral Daily   Or  . thiamine  100 mg Intravenous Daily    OBJECTIVE   Intake/Output Summary (Last 24 hours) at 03/27/14 1203 Last data filed at 03/27/14 1100  Gross per 24 hour  Intake    243 ml  Output   3975 ml  Net  -3732 ml   Filed Weights   03/26/14 1530 03/27/14 0444  Weight: 200 lb 8 oz (90.946 kg) 201 lb 4.5 oz (91.3 kg)    PHYSICAL EXAM Filed Vitals:   03/26/14 1751 03/26/14 2040 03/27/14 0444 03/27/14 1007  BP: 152/103 119/87 140/105 142/101  Pulse: 101 84 85 90  Temp: 97.6 F (36.4 C) 97.7 F (36.5 C) 97.5 F (36.4 C)   TempSrc: Oral Oral Oral   Resp: 18 18 18    Height:      Weight:   201 lb 4.5 oz (91.3 kg)   SpO2: 98% 99% 100%    General: Alert, oriented x3, no distress Head: no evidence of trauma, PERRL, EOMI, no exophtalmos or lid lag, no myxedema, no xanthelasma; normal ears, nose and oropharynx Neck: normal jugular venous pulsations and no hepatojugular reflux; brisk carotid pulses without delay and no carotid bruits Chest: clear to auscultation, no signs of consolidation by percussion or palpation, normal fremitus, symmetrical and full respiratory  excursions Cardiovascular: normal position and quality of the apical impulse, regular rhythm, normal first and second heart sounds, no rubs or gallops, no murmur Abdomen: no tenderness or distention, no masses by palpation, no abnormal pulsatility or arterial bruits, normal bowel sounds, no hepatosplenomegaly Extremities: no clubbing, cyanosis or edema; 2+ radial, ulnar and brachial pulses bilaterally; 2+ right femoral, posterior tibial and dorsalis pedis pulses; 2+ left femoral, posterior tibial and dorsalis pedis pulses; no subclavian or femoral bruits Neurological: grossly nonfocal  LABS  CBC  Recent Labs  03/26/14 1422  WBC 7.5  NEUTROABS 5.0  HGB 15.5  HCT 46.2  MCV 88.0  PLT 227   Basic Metabolic Panel  Recent Labs  03/26/14 1422 03/27/14 0100  NA 142 140  K 4.6 3.9  CL 106 102  CO2 22 24  GLUCOSE 132* 106*  BUN 18 20  CREATININE 1.35 1.35  CALCIUM 9.0 8.5  MG  --  2.3   Liver Function Tests  Recent Labs  03/26/14 1422  AST 60*  ALT 116*  ALKPHOS 89  BILITOT 0.8  PROT 6.3  ALBUMIN 3.1*   No results found for this basename: LIPASE, AMYLASE,  in the last 72 hours Cardiac Enzymes  Recent Labs  03/26/14 1422  03/26/14 1953 03/27/14 0100  TROPONINI <0.30 <0.30 <0.30   BNP No components found with this basename: POCBNP,  D-Dimer No results found for this basename: DDIMER,  in the last 72 hours Hemoglobin A1C No results found for this basename: HGBA1C,  in the last 72 hours Fasting Lipid Panel  Recent Labs  03/27/14 0100  CHOL 111  HDL 39*  LDLCALC 56  TRIG 81  CHOLHDL 2.8   Thyroid Function Tests  Recent Labs  03/26/14 1422  TSH 2.710    Radiology Studies Imaging results have been reviewed and Dg Chest 2 View  03/26/2014   CLINICAL DATA:  New CHF.  History of hypertension.  Smoker.  EXAM: CHEST  2 VIEW  COMPARISON:  03/22/2014  FINDINGS: Mild cardiomegaly. No confluent opacities in the lungs. No effusions or edema. No acute bony  abnormality.  IMPRESSION: Cardiomegaly.  No active disease.   Electronically Signed   By: Charlett NoseKevin  Dover M.D.   On: 03/26/2014 17:26    TELE NSVT 9 beats  ECG Unavailable for review  ASSESSMENT AND PLAN  Moderate to severe cardiomyopathy, possibly ischemic CHF, acute systolic  Needs right and left heart cath, but not ready yet -cannot lie flat. Plan for Thursday. Continue diuretics. Increase ACEi. Switch to carvedilol. Discussed dfaily weight monitoring, sodium restriction, signs and symptoms of acute HF.   Thurmon FairMihai Lovelace Cerveny, MD, [facility] [phone] office [phone] pager 03/27/2014 12:03 PM

## 2014-03-28 LAB — BASIC METABOLIC PANEL
BUN: 17 mg/dL (ref 6–23)
CALCIUM: 8.8 mg/dL (ref 8.4–10.5)
CHLORIDE: 104 meq/L (ref 96–112)
CO2: 27 mEq/L (ref 19–32)
Creatinine, Ser: 1.38 mg/dL — ABNORMAL HIGH (ref 0.50–1.35)
GFR calc non Af Amer: 60 mL/min — ABNORMAL LOW (ref >90)
GFR, EST AFRICAN AMERICAN: 69 mL/min — AB (ref >90)
Glucose, Bld: 107 mg/dL — ABNORMAL HIGH (ref 70–99)
Potassium: 4.2 mEq/L (ref 3.7–5.3)
SODIUM: 142 meq/L (ref 137–147)

## 2014-03-28 MED ORDER — FUROSEMIDE 40 MG PO TABS
40.0000 mg | ORAL_TABLET | Freq: Once | ORAL | Status: AC
  Administered 2014-03-28: 40 mg via ORAL
  Filled 2014-03-28: qty 1

## 2014-03-28 NOTE — Progress Notes (Signed)
Subjective: BReathing better  Objective: Vital signs in last 24 hours: Temp:  [97.7 F (36.5 C)-98.1 F (36.7 C)] 97.7 F (36.5 C) (04/22 0500) Pulse Rate:  [83-94] 94 (04/22 0715) Resp:  [18] 18 (04/21 1345) BP: (125-154)/(94-124) 137/98 mmHg (04/22 1004) SpO2:  [98 %-100 %] 98 % (04/22 0500) Weight:  [194 lb (87.998 kg)] 194 lb (87.998 kg) (04/22 0500) Last BM Date: 03/27/14  Intake/Output from previous day: 04/21 0701 - 04/22 0700 In: 960 [P.O.:960] Out: 2425 [Urine:2425] Intake/Output this shift: Total I/O In: 240 [P.O.:240] Out: -   Medications Current Facility-Administered Medications  Medication Dose Route Frequency Provider Last Rate Last Dose  . 0.9 %  sodium chloride infusion  250 mL Intravenous PRN Dayna N Dunn, PA-C      . acetaminophen (TYLENOL) tablet 650 mg  650 mg Oral Q4H PRN Dayna N Dunn, PA-C   650 mg at 03/26/14 2145  . aspirin EC tablet 81 mg  81 mg Oral Daily Dayna N Dunn, PA-C   81 mg at 03/28/14 1004  . carvedilol (COREG) tablet 6.25 mg  6.25 mg Oral BID WC Noma Quijas, MD   6.25 mg at 03/28/14 0715  . folic acid (FOLVITE) tablet 1 mg  1 mg Oral Daily Dayna N Dunn, PA-C   1 mg at 03/28/14 1004  . furosemide (LASIX) injection 40 mg  40 mg Intravenous BID Dayna N Dunn, PA-C   40 mg at 03/28/14 0715  . heparin injection 5,000 Units  5,000 Units Subcutaneous 3 times per day Laurann Montana, PA-C   5,000 Units at 03/28/14 0549  . hydrALAZINE (APRESOLINE) injection 10 mg  10 mg Intravenous Q6H PRN Abelino Derrick, PA-C      . lisinopril (PRINIVIL,ZESTRIL) tablet 40 mg  40 mg Oral Daily Rio Kidane, MD   40 mg at 03/28/14 1004  . LORazepam (ATIVAN) tablet 1 mg  1 mg Oral Q6H PRN Dayna N Dunn, PA-C       Or  . LORazepam (ATIVAN) injection 1 mg  1 mg Intravenous Q6H PRN Dayna N Dunn, PA-C      . LORazepam (ATIVAN) tablet 0-4 mg  0-4 mg Oral Q6H Dayna N Dunn, PA-C       Followed by  . LORazepam (ATIVAN) tablet 0-4 mg  0-4 mg Oral Q12H Dayna N Dunn, PA-C       . multivitamin with minerals tablet 1 tablet  1 tablet Oral Daily Dayna N Dunn, PA-C   1 tablet at 03/28/14 1004  . ondansetron (ZOFRAN) injection 4 mg  4 mg Intravenous Q6H PRN Dayna N Dunn, PA-C   4 mg at 03/27/14 2307  . pantoprazole (PROTONIX) EC tablet 40 mg  40 mg Oral Q0600 Jaylee Freeze, MD   40 mg at 03/28/14 0550  . sodium chloride 0.9 % injection 3 mL  3 mL Intravenous Q12H Dayna N Dunn, PA-C   3 mL at 03/28/14 1005  . sodium chloride 0.9 % injection 3 mL  3 mL Intravenous PRN Dayna N Dunn, PA-C      . thiamine (VITAMIN B-1) tablet 100 mg  100 mg Oral Daily Dayna N Dunn, PA-C   100 mg at 03/28/14 1004   Or  . thiamine (B-1) injection 100 mg  100 mg Intravenous Daily Dayna N Dunn, PA-C      . zolpidem (AMBIEN) tablet 5 mg  5 mg Oral QHS PRN Thurmon Fair, MD   5 mg at 03/27/14 2307  PE: General appearance: alert, cooperative and no distress Neck: Mild JVD Lungs: clear to auscultation bilaterally Heart: regular rate and rhythm, S1, S2 normal, no murmur, click, rub or gallop Abdomen: +BS, nontender Extremities: 1+ right ankle edema.  Trace on the left.  Pulses: 2+ and symmetric Skin: Warm and dry Neurologic: Grossly normal  Lab Results:   Recent Labs  03/26/14 1422  WBC 7.5  HGB 15.5  HCT 46.2  PLT 227   BMET  Recent Labs  03/26/14 1422 03/27/14 0100 03/28/14 0450  NA 142 140 142  K 4.6 3.9 4.2  CL 106 102 104  CO2 22 24 27   GLUCOSE 132* 106* 107*  BUN 18 20 17   CREATININE 1.35 1.35 1.38*  CALCIUM 9.0 8.5 8.8   PT/INR  Recent Labs  03/26/14 1422  LABPROT 15.9*  INR 1.30   Cholesterol  Recent Labs  03/27/14 0100  CHOL 111     Assessment/Plan  48 year old male for evaluation of congestive heart failure. Seen recently in the emergency room with symptoms of CHF. Chest x-ray showed bilateral diffuse interstitial thickening and peribronchial cuffing most concerning for bronchitis. Hemoglobin 15.3. BUN 17 and creatinine 1.2. SGOT 44 SGPT 79.  Pro BNP 3661. Patient was given IV Lasix. He declined to be admitted. He was asked to followup with cardiology. Patient states that he has had progressive dyspnea on exertion since December. There is orthopnea. He has developed bilateral lower extremity edema over the past one week. He has occasional chest pressure not related to exertion without radiation. He has had a nonproductive cough as well. No fevers, chills but there has been diaphoresis. Note he has hypertension for several years but has not taken his medications.  Principal Problem:   Acute systolic CHF (congestive heart failure) Net fluids:  -1.5L/-4.4L.  Lasix 40mg  IV bid.  Lisinopril 40mg .  He looks pretty good.  Better than previously described.  He was just about flat in the bed when I came in.  He received his AM IV lasix this morning.  Will change evening dose to PO.   I wrote tonight's dose for "once" so we do not give an AM dose before the cath.  Restart tomorrow night.  Active Problems:   Hypertension  Coreg 6.25BID.  BP stable and a little better.    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  EF 30%  Right and left heart caths for tomorrow.    LOS: 2 days    Wilburt FinlayBryan Hager PA-C 03/28/2014 10:32 AM  I have seen and examined the patient along with Wilburt FinlayBryan Hager PA-C.  I have reviewed the chart, notes and new data.  I agree with PA's note.  Key new complaints: marked improvement in dyspnea, able to walk the hall, no longer orthopneic Key examination changes: JVP normal, can still hear an S3 when supine Key new findings / data: no real change in renal function  PLAN: Right and left heart cath tomorrow.  This procedure has been fully reviewed with the patient and informed consent has been obtained. Did not have a discussion re: possible need for AICD/Life Vest yet. Will defer until we know whether his cardiomyopathy is ischemic or not    Thurmon FairMihai Zaliyah Meikle, MD, [facility] [phone] 03/28/2014, 11:28 AM

## 2014-03-29 ENCOUNTER — Encounter (HOSPITAL_COMMUNITY)
Admission: AD | Disposition: A | Discharge status: Home / Self Care | Payer: Self-pay | Source: Ambulatory Visit | Attending: Cardiology

## 2014-03-29 DIAGNOSIS — I2510 Atherosclerotic heart disease of native coronary artery without angina pectoris: Secondary | ICD-10-CM

## 2014-03-29 HISTORY — PX: CORONARY ANGIOGRAM: SHX5786

## 2014-03-29 HISTORY — PX: LEFT AND RIGHT HEART CATHETERIZATION WITH CORONARY ANGIOGRAM: SHX5449

## 2014-03-29 LAB — POCT I-STAT 3, VENOUS BLOOD GAS (G3P V)
Acid-Base Excess: 1 mmol/L (ref 0.0–2.0)
BICARBONATE: 26.2 meq/L — AB (ref 20.0–24.0)
O2 Saturation: 67 %
PCO2 VEN: 41.6 mmHg — AB (ref 45.0–50.0)
PH VEN: 7.407 — AB (ref 7.250–7.300)
PO2 VEN: 35 mmHg (ref 30.0–45.0)
TCO2: 27 mmol/L (ref 0–100)

## 2014-03-29 LAB — CBC
HEMATOCRIT: 42.7 % (ref 39.0–52.0)
Hemoglobin: 14.5 g/dL (ref 13.0–17.0)
MCH: 30 pg (ref 26.0–34.0)
MCHC: 34 g/dL (ref 30.0–36.0)
MCV: 88.2 fL (ref 78.0–100.0)
Platelets: 210 10*3/uL (ref 150–400)
RBC: 4.84 MIL/uL (ref 4.22–5.81)
RDW: 13 % (ref 11.5–15.5)
WBC: 7.5 10*3/uL (ref 4.0–10.5)

## 2014-03-29 LAB — POCT I-STAT 3, ART BLOOD GAS (G3+)
Acid-Base Excess: 1 mmol/L (ref 0.0–2.0)
BICARBONATE: 25.8 meq/L — AB (ref 20.0–24.0)
O2 SAT: 86 %
TCO2: 27 mmol/L (ref 0–100)
pCO2 arterial: 42.1 mmHg (ref 35.0–45.0)
pH, Arterial: 7.395 (ref 7.350–7.450)
pO2, Arterial: 52 mmHg — ABNORMAL LOW (ref 80.0–100.0)

## 2014-03-29 LAB — BASIC METABOLIC PANEL
BUN: 17 mg/dL (ref 6–23)
CO2: 24 mEq/L (ref 19–32)
Calcium: 8.7 mg/dL (ref 8.4–10.5)
Chloride: 101 mEq/L (ref 96–112)
Creatinine, Ser: 1.46 mg/dL — ABNORMAL HIGH (ref 0.50–1.35)
GFR calc Af Amer: 64 mL/min — ABNORMAL LOW (ref >90)
GFR, EST NON AFRICAN AMERICAN: 56 mL/min — AB (ref >90)
Glucose, Bld: 112 mg/dL — ABNORMAL HIGH (ref 70–99)
POTASSIUM: 4 meq/L (ref 3.7–5.3)
SODIUM: 138 meq/L (ref 137–147)

## 2014-03-29 LAB — CREATININE, SERUM
Creatinine, Ser: 1.3 mg/dL (ref 0.50–1.35)
GFR calc Af Amer: 74 mL/min — ABNORMAL LOW (ref >90)
GFR calc non Af Amer: 64 mL/min — ABNORMAL LOW (ref >90)

## 2014-03-29 LAB — POCT ACTIVATED CLOTTING TIME: Activated Clotting Time: 193 seconds

## 2014-03-29 SURGERY — LEFT AND RIGHT HEART CATHETERIZATION WITH CORONARY ANGIOGRAM
Anesthesia: LOCAL

## 2014-03-29 MED ORDER — LIDOCAINE HCL (PF) 1 % IJ SOLN
INTRAMUSCULAR | Status: AC
  Filled 2014-03-29: qty 30

## 2014-03-29 MED ORDER — SODIUM CHLORIDE 0.9 % IJ SOLN: 3.0000 mL | INTRAMUSCULAR | Status: DC | PRN

## 2014-03-29 MED ORDER — SODIUM CHLORIDE 0.9 % IV SOLN: 250.0000 mL | INTRAVENOUS | Status: DC | PRN

## 2014-03-29 MED ORDER — ASPIRIN 81 MG PO CHEW
81.0000 mg | CHEWABLE_TABLET | ORAL | Status: AC
  Administered 2014-03-29: 81 mg via ORAL
  Filled 2014-03-29: qty 1

## 2014-03-29 MED ORDER — HEPARIN (PORCINE) IN NACL 2-0.9 UNIT/ML-% IJ SOLN
INTRAMUSCULAR | Status: AC
Start: 2014-03-29 — End: 2014-03-29
  Filled 2014-03-29: qty 1500

## 2014-03-29 MED ORDER — MIDAZOLAM HCL 2 MG/2ML IJ SOLN
INTRAMUSCULAR | Status: AC
  Filled 2014-03-29: qty 2

## 2014-03-29 MED ORDER — NITROGLYCERIN 0.2 MG/ML ON CALL CATH LAB
INTRAVENOUS | Status: AC
  Filled 2014-03-29: qty 1

## 2014-03-29 MED ORDER — SODIUM CHLORIDE 0.9 % IJ SOLN: 3.0000 mL | Freq: Two times a day (BID) | INTRAMUSCULAR | Status: DC

## 2014-03-29 MED ORDER — BISACODYL 5 MG PO TBEC
10.0000 mg | DELAYED_RELEASE_TABLET | Freq: Every day | ORAL | Status: DC | PRN
  Administered 2014-03-29: 10 mg via ORAL
  Filled 2014-03-29 (×2): qty 2

## 2014-03-29 MED ORDER — HEPARIN SODIUM (PORCINE) 1000 UNIT/ML IJ SOLN
INTRAMUSCULAR | Status: AC
  Filled 2014-03-29: qty 1

## 2014-03-29 MED ORDER — ISOSORB DINITRATE-HYDRALAZINE 20-37.5 MG PO TABS
1.0000 | ORAL_TABLET | Freq: Two times a day (BID) | ORAL | Status: DC
  Administered 2014-03-29 – 2014-03-31 (×4): 1 via ORAL
  Filled 2014-03-29 (×7): qty 1

## 2014-03-29 MED ORDER — SODIUM CHLORIDE 0.9 % IV SOLN
INTRAVENOUS | Status: DC
  Administered 2014-03-29: 09:00:00 via INTRAVENOUS

## 2014-03-29 MED ORDER — SODIUM CHLORIDE 0.9 % IV SOLN: 1.0000 mL/kg/h | INTRAVENOUS | Status: AC

## 2014-03-29 MED ORDER — SODIUM CHLORIDE 0.9 % IJ SOLN
3.0000 mL | Freq: Two times a day (BID) | INTRAMUSCULAR | Status: DC
  Administered 2014-03-31: 3 mL via INTRAVENOUS

## 2014-03-29 MED ORDER — VERAPAMIL HCL 2.5 MG/ML IV SOLN
INTRAVENOUS | Status: AC
Start: 2014-03-29 — End: 2014-03-29
  Filled 2014-03-29: qty 2

## 2014-03-29 MED ORDER — FENTANYL CITRATE 0.05 MG/ML IJ SOLN
INTRAMUSCULAR | Status: AC
Start: 2014-03-29 — End: 2014-03-29
  Filled 2014-03-29: qty 2

## 2014-03-29 NOTE — Interval H&P Note (Signed)
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 He mostly notes PND/Orthopnea, DOE & edema, but also noted intermittent Chest pain.  The various methods of treatment have been discussed with the patient and family. After consideration of risks, benefits and other options for treatment, the patient has consented to  Procedure(s): LEFT AND RIGHT HEART CATHETERIZATION WITH CORONARY ANGIOGRAM (N/A) as a surgical intervention .  The patient's history has been reviewed, patient examined, no change in status, stable for surgery.  I have reviewed the patient's chart and labs.  Questions were answered to the patient's satisfaction.     Mark Horton  Cath Lab Visit (complete for each Cath Lab visit)  Clinical Evaluation Leading to the Procedure:   ACS: no  Non-ACS:    Anginal Classification: CCS III  NYHA CHF III   Anti-ischemic medical therapy: Maximal Therapy (2 or more classes of medications)  Non-Invasive Test Results: Intermediate-risk stress test findings: cardiac mortality 1-3%/year; EF ~30% with regional WMA. New onset.  Prior CABG: No previous CABG

## 2014-03-29 NOTE — Progress Notes (Signed)
Subjective: Feels good.  Did not sleep well.   Objective: Vital signs in last 24 hours: Temp:  [98.1 F (36.7 C)-98.3 F (36.8 C)] 98.2 F (36.8 C) (04/23 0555) Pulse Rate:  [90-95] 90 (04/23 0555) Resp:  [18] 18 (04/23 0555) BP: (132-146)/(94-115) 132/94 mmHg (04/23 0555) SpO2:  [100 %] 100 % (04/23 0555) Weight:  [187 lb 14.4 oz (85.231 kg)] 187 lb 14.4 oz (85.231 kg) (04/23 0708) Last BM Date: 03/27/14  Intake/Output from previous day: 04/22 0701 - 04/23 0700 In: 240 [P.O.:240] Out: 500 [Urine:500] Intake/Output this shift:    Medications Current Facility-Administered Medications  Medication Dose Route Frequency Provider Last Rate Last Dose  . 0.9 %  sodium chloride infusion  250 mL Intravenous PRN Dayna N Dunn, PA-C      . acetaminophen (TYLENOL) tablet 650 mg  650 mg Oral Q4H PRN Dayna N Dunn, PA-C   650 mg at 03/26/14 2145  . aspirin EC tablet 81 mg  81 mg Oral Daily Dayna N Dunn, PA-C   81 mg at 03/28/14 1004  . carvedilol (COREG) tablet 6.25 mg  6.25 mg Oral BID WC Noal Abshier, MD   6.25 mg at 03/28/14 1714  . folic acid (FOLVITE) tablet 1 mg  1 mg Oral Daily Dayna N Dunn, PA-C   1 mg at 03/28/14 1004  . heparin injection 5,000 Units  5,000 Units Subcutaneous 3 times per day Laurann Montana, PA-C   5,000 Units at 03/29/14 0609  . hydrALAZINE (APRESOLINE) injection 10 mg  10 mg Intravenous Q6H PRN Eda Paschal Kilroy, PA-C      . lisinopril (PRINIVIL,ZESTRIL) tablet 40 mg  40 mg Oral Daily Mekiah Cambridge, MD   40 mg at 03/28/14 1004  . LORazepam (ATIVAN) tablet 1 mg  1 mg Oral Q6H PRN Dayna N Dunn, PA-C       Or  . LORazepam (ATIVAN) injection 1 mg  1 mg Intravenous Q6H PRN Dayna N Dunn, PA-C      . LORazepam (ATIVAN) tablet 0-4 mg  0-4 mg Oral Q12H Dayna N Dunn, PA-C      . multivitamin with minerals tablet 1 tablet  1 tablet Oral Daily Dayna N Dunn, PA-C   1 tablet at 03/28/14 1004  . ondansetron (ZOFRAN) injection 4 mg  4 mg Intravenous Q6H PRN Dayna N Dunn, PA-C   4  mg at 03/28/14 2200  . pantoprazole (PROTONIX) EC tablet 40 mg  40 mg Oral Q0600 Moshe Wenger, MD   40 mg at 03/29/14 0609  . sodium chloride 0.9 % injection 3 mL  3 mL Intravenous Q12H Dayna N Dunn, PA-C   3 mL at 03/28/14 1005  . sodium chloride 0.9 % injection 3 mL  3 mL Intravenous PRN Dayna N Dunn, PA-C      . thiamine (VITAMIN B-1) tablet 100 mg  100 mg Oral Daily Dayna N Dunn, PA-C   100 mg at 03/28/14 1004   Or  . thiamine (B-1) injection 100 mg  100 mg Intravenous Daily Dayna N Dunn, PA-C      . zolpidem (AMBIEN) tablet 5 mg  5 mg Oral QHS PRN Thurmon Fair, MD   5 mg at 03/27/14 2307    PE: General appearance: alert, cooperative and no distress  Neck: - JVD  Lungs: clear to auscultation bilaterally  Heart: regular rate and rhythm, S1, S2 normal, no murmur, click, rub or gallop  Abdomen: +BS, nontender  Extremities: No LEE Pulses: 2+  and symmetric  Skin: Warm and dry  Neurologic: Grossly normal   Lab Results:   Recent Labs  03/26/14 1422  WBC 7.5  HGB 15.5  HCT 46.2  PLT 227   BMET  Recent Labs  03/27/14 0100 03/28/14 0450 03/29/14 0524  NA 140 142 138  K 3.9 4.2 4.0  CL 102 104 101  CO2 24 27 24  GLUCOSE 106* 107* 112*  BUN 20 17 17  CREATININE 1.35 1.38* 1.46*  CALCIUM 8.5 8.8 8.7   PT/INR  Recent Labs  03/26/14 1422  LABPROT 15.9*  INR 1.30   Cholesterol  Recent Labs  03/27/14 0100  CHOL 111     Assessment/Plan Principal Problem:  Acute systolic CHF (congestive heart failure)  Net fluids: -0.3L/-4.7L. Lasix 40mg PO BID.  Held AM dose for cath.  Lisinopril 40mg.  He looks good. Lasix held this morning and will likely hold tonights dose given SCr has increased slightly.  I am going to give him 50ml/hr for 5 hours before caths.  Hold ACE and use PRN hydralazine if BP goes up.   Active Problems:  Hypertension  Coreg 6.25BID. BP stable and a little better.  Hold ACE and use PRN hydralazine if BP goes up.  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    LOS: 3 days    Bryan Hager PA-C 03/29/2014 8:14 AM  I have seen and examined the patient along with Bryan Hager, PA.  I have reviewed the chart, notes and new data.  I agree with PA's note.  Key new complaints: a little anxious about the bedrest after cath Key examination changes: normal LVP, no S3 Key new findings / data: creat up to 1.46  PLAN: R&L cardiac cath. This procedure has been fully reviewed with the patient and written informed consent has been obtained. If left heart filling pressures are not high at cath, increase carvedilol to 12.5 mg BID. Otherwise wait until follow up office appointment. Discuss AICD/Life Vest depending on cath findings. Raiza Kiesel, MD, FACC [facility] [phone] 03/29/2014, 10:47 AM  

## 2014-03-29 NOTE — H&P (View-Only) (Signed)
Subjective: Feels good.  Did not sleep well.   Objective: Vital signs in last 24 hours: Temp:  [98.1 F (36.7 C)-98.3 F (36.8 C)] 98.2 F (36.8 C) (04/23 0555) Pulse Rate:  [90-95] 90 (04/23 0555) Resp:  [18] 18 (04/23 0555) BP: (132-146)/(94-115) 132/94 mmHg (04/23 0555) SpO2:  [100 %] 100 % (04/23 0555) Weight:  [187 lb 14.4 oz (85.231 kg)] 187 lb 14.4 oz (85.231 kg) (04/23 0708) Last BM Date: 03/27/14  Intake/Output from previous day: 04/22 0701 - 04/23 0700 In: 240 [P.O.:240] Out: 500 [Urine:500] Intake/Output this shift:    Medications Current Facility-Administered Medications  Medication Dose Route Frequency Provider Last Rate Last Dose  . 0.9 %  sodium chloride infusion  250 mL Intravenous PRN Dayna N Dunn, PA-C      . acetaminophen (TYLENOL) tablet 650 mg  650 mg Oral Q4H PRN Dayna N Dunn, PA-C   650 mg at 03/26/14 2145  . aspirin EC tablet 81 mg  81 mg Oral Daily Dayna N Dunn, PA-C   81 mg at 03/28/14 1004  . carvedilol (COREG) tablet 6.25 mg  6.25 mg Oral BID WC Timberlynn Kizziah, MD   6.25 mg at 03/28/14 1714  . folic acid (FOLVITE) tablet 1 mg  1 mg Oral Daily Dayna N Dunn, PA-C   1 mg at 03/28/14 1004  . heparin injection 5,000 Units  5,000 Units Subcutaneous 3 times per day Laurann Montana, PA-C   5,000 Units at 03/29/14 0609  . hydrALAZINE (APRESOLINE) injection 10 mg  10 mg Intravenous Q6H PRN Eda Paschal Kilroy, PA-C      . lisinopril (PRINIVIL,ZESTRIL) tablet 40 mg  40 mg Oral Daily Jianni Shelden, MD   40 mg at 03/28/14 1004  . LORazepam (ATIVAN) tablet 1 mg  1 mg Oral Q6H PRN Dayna N Dunn, PA-C       Or  . LORazepam (ATIVAN) injection 1 mg  1 mg Intravenous Q6H PRN Dayna N Dunn, PA-C      . LORazepam (ATIVAN) tablet 0-4 mg  0-4 mg Oral Q12H Dayna N Dunn, PA-C      . multivitamin with minerals tablet 1 tablet  1 tablet Oral Daily Dayna N Dunn, PA-C   1 tablet at 03/28/14 1004  . ondansetron (ZOFRAN) injection 4 mg  4 mg Intravenous Q6H PRN Dayna N Dunn, PA-C   4  mg at 03/28/14 2200  . pantoprazole (PROTONIX) EC tablet 40 mg  40 mg Oral Q0600 Damarion Mendizabal, MD   40 mg at 03/29/14 0609  . sodium chloride 0.9 % injection 3 mL  3 mL Intravenous Q12H Dayna N Dunn, PA-C   3 mL at 03/28/14 1005  . sodium chloride 0.9 % injection 3 mL  3 mL Intravenous PRN Dayna N Dunn, PA-C      . thiamine (VITAMIN B-1) tablet 100 mg  100 mg Oral Daily Dayna N Dunn, PA-C   100 mg at 03/28/14 1004   Or  . thiamine (B-1) injection 100 mg  100 mg Intravenous Daily Dayna N Dunn, PA-C      . zolpidem (AMBIEN) tablet 5 mg  5 mg Oral QHS PRN Thurmon Fair, MD   5 mg at 03/27/14 2307    PE: General appearance: alert, cooperative and no distress  Neck: - JVD  Lungs: clear to auscultation bilaterally  Heart: regular rate and rhythm, S1, S2 normal, no murmur, click, rub or gallop  Abdomen: +BS, nontender  Extremities: No LEE Pulses: 2+  and symmetric  Skin: Warm and dry  Neurologic: Grossly normal   Lab Results:   Recent Labs  03/26/14 1422  WBC 7.5  HGB 15.5  HCT 46.2  PLT 227   BMET  Recent Labs  03/27/14 0100 03/28/14 0450 03/29/14 0524  NA 140 142 138  K 3.9 4.2 4.0  CL 102 104 101  CO2 24 27 24   GLUCOSE 106* 107* 112*  BUN 20 17 17   CREATININE 1.35 1.38* 1.46*  CALCIUM 8.5 8.8 8.7   PT/INR  Recent Labs  03/26/14 1422  LABPROT 15.9*  INR 1.30   Cholesterol  Recent Labs  03/27/14 0100  CHOL 111     Assessment/Plan Principal Problem:  Acute systolic CHF (congestive heart failure)  Net fluids: -0.3L/-4.7L. Lasix 40mg  PO BID.  Held AM dose for cath.  Lisinopril 40mg .  He looks good. Lasix held this morning and will likely hold tonights dose given SCr has increased slightly.  I am going to give him 7950ml/hr for 5 hours before caths.  Hold ACE and use PRN hydralazine if BP goes up.   Active Problems:  Hypertension  Coreg 6.25BID. BP stable and a little better.  Hold ACE and use PRN hydralazine if BP goes up.  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    LOS: 3 days    Wilburt FinlayBryan Hager PA-C 03/29/2014 8:14 AM  I have seen and examined the patient along with Wilburt FinlayBryan Hager, PA.  I have reviewed the chart, notes and new data.  I agree with PA's note.  Key new complaints: a little anxious about the bedrest after cath Key examination changes: normal LVP, no S3 Key new findings / data: creat up to 1.46  PLAN: R&L cardiac cath. This procedure has been fully reviewed with the patient and written informed consent has been obtained. If left heart filling pressures are not high at cath, increase carvedilol to 12.5 mg BID. Otherwise wait until follow up office appointment. Discuss AICD/Life Vest depending on cath findings. Thurmon FairMihai Eartha Vonbehren, MD, [facility] Center[facility]ACC [facility] [phone] 03/29/2014, 10:47 AM

## 2014-03-29 NOTE — CV Procedure (Signed)
CARDIAC CATHETERIZATION REPORT  NAME:  Rosser Collington   MRN: [id number] DOB:  07/22/1966   ADMIT DATE: 03/26/2014 Procedure Date: 03/29/2014  INTERVENTIONAL CARDIOLOGIST: Leonie Man, M.D., MS PRIMARY CARE PROVIDER: No primary provider on file. PRIMARY CARDIOLOGIST:  PATIENT:  Mark Horton is a 48 y.o. male with new diagnosis of cardiomyopathy of unknown etiology. EF roughly 30% with what appears to be inferior hypokinesis. He is not Harrel Lemon had anginal discomfort but has had some tightness in his chest associated with PND, orthopnea and edema as well as exertional dyspnea. He is referred for right heart catheterization to delineate his coronary anatomy and to determine the exttent of diuresis.  PRE-OPERATIVE DIAGNOSIS:    New diagnosis of cardiomyopathy, unclear etiology  PROCEDURES PERFORMED:    Right & Left Heart Catheterization With Coronary Angiography via Right Radial Artery and Right Brachial Vein  PROCEDURE:Consent:  Risks of procedure as well as the alternatives and risks of each were explained to the (patient/caregiver).  Consent for procedure obtained. Consent for signed by MD and patient with RN witness -- placed on chart.   PROCEDURE: The patient was brought to the [facility] in the fasting state and prepped and draped in the usual sterile fashion for Right groin or radial access. A modified Allen's test with plethysmography was performed, revealing excellent Ulnar artery collateral flow.  Sterile technique was used including antiseptics, cap, gloves, gown, hand hygiene, mask and sheet.  Skin prep: Chlorhexidine. A 20-gauge Right Antecubital Vein IV was started by the cath lab nurse staff using sterile technique and draped sterilely.  Time Out: Verified patient identification, verified procedure, site/side was marked, verified correct patient position, special equipment/implants available, medications/allergies/relevent history reviewed,  required imaging and test results available.  Performed  Access:   Right Radial Artery: 6 Fr Sheath - Seldinger Technique (Angiocath Micro-Puncture Kit)  4500 Units IV Heparin; 10 mL Radial Cocktail  Right Antecubital/Brachial Vein: The existing 20-gauge IV was exchanged over an eye doctor wire for a 6 French glide sheath.  Right Heart Catheterization: 5 Swan Ganz catheter advanced under fluoroscopy with balloon inflated to the RA, RV, then PCWP-PA for hemodynamic measurement.  Simultaneous FA & PA blood gases checked for SaO2% to calculate FICK CO/CI  Catheter removed completely out of the body with balloon deflated.  TR Band:  1515 Hours, 13 mL air  Hemodynamics:  SaO2%  Pressures mmHg  Mean P mmHg  EDP mmHg   Right Atrium    21/20   17    Right Ventricle    52/15    27   Pulmonary Artery   67   50/27   37    PCWP    33/37   33    Central Aortic   86   120/104   117    Left Ventricle    130/19   34          Cardiac Output:   Cardiac Index:    Fick   6.74   3.32     Left Ventriculography: Not performed to conserve contrast  Coronary Anatomy: Right dominant  Left Main: Large-caliber vessel that is very short/cloacal. Bifurcates into LAD and Circumflex. Angiographically normal. LAD: Large-caliber vessel with mild proximal calcification. There is a very proximal first diagonal branch and septal perforator followed by a roughly 40% tubular ossified lesion before the vessel somewhat normalizes wraps around the apex perfusing the inferoapex. There is a second diagonal branch in the  mid vessel is also free of disease. The remainder of the LAD systems is free of significant disease.  D1: Small-caliber very proximal vessel, almost ramus intermedius. Angiographic normal.  D2: Moderate caliber vessel the mid LAD. Angiographically normal.  Left Circumflex: Large caliber, nondominant that bifurcates in the mid vessel into OM 1 and the AV groove circumflex was bifurcates into LPL 1&2. Both  vessels are large in diameter and bifurcates into a smaller diameter branches. OM1 branches into a smaller superior branch and a larger inferior branch is very tortuous. LPL 1 and 2 are both moderate caliber.  Angiographically normal   RCA: Very large caliber, dominant vessel with a [place]'s [place] bend. There are 2 small moderate caliber marginal branches. The vessel bifurcates distally into small to moderate caliber right posterior descending artery (RPDA) Right Posterior AV Groove Branch (RPAV).  Minimal luminal irregularities.  MEDICATIONS:  Anesthesia:  Local Lidocaine 2 ml  Sedation:  1 mg IV Versed, 50 mcg IV fentanyl ;   Omnipaque Contrast: 70 ml  Anticoagulation:  IV Heparin 4500 Units ;  Radial Cocktail: 5 mg Verapamil, 400 mcg NTG, 2 ml 2% Lidocaine in 10 ml NS  PATIENT DISPOSITION:    The patient was transferred to the PACU holding area in a hemodynamicaly stable, chest pain free condition.  The patient tolerated the procedure well, and there were no complications.  EBL:   < 10 ml  The patient was stable before, during, and after the procedure.  POST-OPERATIVE DIAGNOSIS:    Non-ischemic Cardiomyopathy with mostly angiographically normal Coronary Arteries.  Severely Elevated LVEDP  Preserved CO & CI despite reduced LVEF  PLAN OF CARE:  Return to Nursing Unit for post radial/brachial catheterization care  Per d/w Dr. Sallyanne Kuster, will add Bidil & not titrate Coreg.  Will need continued diuresis   Results reviewed with Dr. Sallyanne Kuster.  Leonie Man, M.D., M.S. [facility] [address] [place], [place]  [address]  [phone]  03/29/2014 3:14 PM

## 2014-03-30 ENCOUNTER — Encounter (HOSPITAL_COMMUNITY): Payer: Self-pay | Admitting: Cardiology

## 2014-03-30 DIAGNOSIS — I428 Other cardiomyopathies: Secondary | ICD-10-CM | POA: Diagnosis present

## 2014-03-30 DIAGNOSIS — I119 Hypertensive heart disease without heart failure: Secondary | ICD-10-CM | POA: Diagnosis present

## 2014-03-30 MED ORDER — FUROSEMIDE 10 MG/ML IJ SOLN
40.0000 mg | Freq: Two times a day (BID) | INTRAMUSCULAR | Status: DC
  Administered 2014-03-30 – 2014-03-31 (×3): 40 mg via INTRAVENOUS
  Filled 2014-03-30 (×4): qty 4

## 2014-03-30 MED ORDER — FUROSEMIDE 10 MG/ML IJ SOLN: 40.0000 mg | Freq: Two times a day (BID) | INTRAMUSCULAR | Status: DC

## 2014-03-30 NOTE — Progress Notes (Addendum)
    Subjective:  No SOB, no history of syncope  Objective:  Vital Signs in the last 24 hours: Temp:  [97.3 F (36.3 C)-97.4 F (36.3 C)] 97.3 F (36.3 C) (04/24 0608) Pulse Rate:  [84-92] 84 (04/24 0608) Resp:  [16] 16 (04/24 0608) BP: (130-155)/(90-105) 140/92 mmHg (04/24 0608) SpO2:  [99 %] 99 % (04/24 0608)  Intake/Output from previous day:  Intake/Output Summary (Last 24 hours) at 03/30/14 0849 Last data filed at 03/29/14 1800  Gross per 24 hour  Intake    730 ml  Output      0 ml  Net    730 ml    Physical Exam: General appearance: alert, cooperative and no distress Lungs: clear to auscultation bilaterally Heart: regular rate and rhythm Extremities: Rt wrist without ecchymosis or hematoma   Rate: 86  Rhythm: normal sinus rhythm and no ectopy  Lab Results:  Recent Labs  03/29/14 1930  WBC 7.5  HGB 14.5  PLT 210    Recent Labs  03/28/14 0450 03/29/14 0524 03/29/14 1930  NA 142 138  --   K 4.2 4.0  --   CL 104 101  --   CO2 27 24  --   GLUCOSE 107* 112*  --   BUN 17 17  --   CREATININE 1.38* 1.46* 1.30   No results found for this basename: TROPONINI, CK, MB,  in the last 72 hours No results found for this basename: INR,  in the last 72 hours  Imaging: Imaging results have been reviewed  Cardiac Studies:  Assessment/Plan:   Principal Problem:   Acute systolic congestive heart failure Active Problems:   Non-ischemic cardiomyopathy- cath 03/29/14   Hypertension   Tobacco abuse Hypertensive heart disease   PLAN: ? -OK to discharge. He will need f/u BMP as an OP.   Corine Shelter PA-C Beeper [phone] 03/30/2014, 8:49 AM   I have seen and examined the patient along with Corine Shelter PA-C.  I have reviewed the chart, notes and new data.  I agree with PA's note.  He feels well this AM, lying flat. RHC showed he still has severely elevated filling pressures and at the end of the cath procedure he had symptom of pulmonary edema. Creatinine  lower.  PLAN: He is not yet at "dry weight" and needs additional diuresis to avoid readmission. IV diuretics today. Reevaluate for possible DC tomorrow. Premature to increase carvedilol.  Thurmon Fair, MD, [facility] [phone] 03/30/2014, 9:06 AM

## 2014-03-30 NOTE — Care Management Note (Signed)
    Page 1 of 1   03/30/2014     4:01:24 PM CARE MANAGEMENT NOTE 03/30/2014  Patient:  Mark Horton, Mark Horton   Account Number:  [id number]  Date Initiated:  03/30/2014  Documentation initiated by:  GRAVES-BIGELOW,Alecxis Baltzell  Subjective/Objective Assessment:   Pt admitted for CHF. Pt states he uses [address]     Action/Plan:   CM did call [facility] and left vm for FEE RN to make them aware that pt was in hospital. Pt will nott need HH services at d/c.   Anticipated DC Date:  03/31/2014   Anticipated DC Plan:  HOME/SELF CARE      DC Planning Services  CM consult      Choice offered to / List presented to:             Status of service:  Completed, signed off Medicare Important Message given?   (If response is "NO", the following Medicare IM given date fields will be blank) Date Medicare IM given:   Date Additional Medicare IM given:    Discharge Disposition:  HOME/SELF CARE  Per UR Regulation:  Reviewed for med. necessity/level of care/duration of stay  If discussed at Long Length of Stay Meetings, dates discussed:    Comments:

## 2014-03-30 NOTE — Progress Notes (Signed)
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 Anticipated return to work May 4th. He will need a note at discharge.   Corine Shelter PA-C 03/30/2014 9:11 AM

## 2014-03-31 ENCOUNTER — Encounter (HOSPITAL_COMMUNITY): Payer: Self-pay | Admitting: Nurse Practitioner

## 2014-03-31 LAB — BASIC METABOLIC PANEL
BUN: 16 mg/dL (ref 6–23)
CO2: 24 mEq/L (ref 19–32)
Calcium: 8.6 mg/dL (ref 8.4–10.5)
Chloride: 103 mEq/L (ref 96–112)
Creatinine, Ser: 1.3 mg/dL (ref 0.50–1.35)
GFR calc Af Amer: 74 mL/min — ABNORMAL LOW (ref >90)
GFR calc non Af Amer: 64 mL/min — ABNORMAL LOW (ref >90)
Glucose, Bld: 105 mg/dL — ABNORMAL HIGH (ref 70–99)
Potassium: 3.9 mEq/L (ref 3.7–5.3)
Sodium: 140 mEq/L (ref 137–147)

## 2014-03-31 MED ORDER — LISINOPRIL 40 MG PO TABS: 40.0000 mg | ORAL_TABLET | Freq: Every day | ORAL | Status: DC

## 2014-03-31 MED ORDER — ASPIRIN 81 MG PO TBEC: 81.0000 mg | DELAYED_RELEASE_TABLET | Freq: Every day | ORAL | Status: AC

## 2014-03-31 MED ORDER — CARVEDILOL 6.25 MG PO TABS: 6.2500 mg | ORAL_TABLET | Freq: Two times a day (BID) | ORAL | Status: DC

## 2014-03-31 MED ORDER — ISOSORB DINITRATE-HYDRALAZINE 20-37.5 MG PO TABS: 1.0000 | ORAL_TABLET | Freq: Two times a day (BID) | ORAL | Status: AC

## 2014-03-31 MED ORDER — FUROSEMIDE 40 MG PO TABS: 40.0000 mg | ORAL_TABLET | Freq: Two times a day (BID) | ORAL | Status: DC

## 2014-03-31 MED ORDER — ATORVASTATIN CALCIUM 10 MG PO TABS: 10.0000 mg | ORAL_TABLET | Freq: Every day | ORAL | Status: DC

## 2014-03-31 NOTE — Discharge Summary (Signed)
Discharge Summary   Patient ID: Mark Horton,  MRN: [id number], DOB/AGE: [birth year] 48 y.o.  Admit date: 03/26/2014 Discharge date: 03/31/2014  Primary Care Provider: [facility] Primary Cardiologist: B. Jens Som, MD   Discharge Diagnoses Principal Problem:   Acute systolic congestive heart failure  **Net negative diuresis of 6.8 Liters this admission with reduction in weight from 200 lbs to 186 lbs on discharge.  Active Problems:   Non-ischemic cardiomyopathy  ** EF 30% by echo this admission.  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   Tobacco abuse   Hypertensive heart disease  Allergies No Known Allergies  Procedures  2D Echocardiogram 4.20.2015  Study Conclusions  - Left ventricle: LVEF is 30% with diffuse hypokinesis,   worse in the inferior, inferoseptal and apical walls. The   cavity size was severely dilated. Wall thickness was   normal. - Pulmonary arteries: PA peak pressure: 75mm Hg (S). _____________   Cardiac Catheterization 4.23.2015  Hemodynamics:   SaO2%   Pressures mmHg   Mean P mmHg   EDP mmHg    Right Atrium      21/20    17      Right Ventricle      52/15      27    Pulmonary Artery    67    50/27    37      PCWP      33/37    33      Central Aortic    86    120/104    117      Left Ventricle      130/19     34                 Cardiac Output:     Cardiac Index:      Fick    6.74     3.32      Left Ventriculography: Not performed to conserve contrast  Coronary Anatomy: Right dominant  Left Main: Large-caliber vessel that is very short/cloacal. Bifurcates into LAD and Circumflex. Angiographically normal. LAD: Large-caliber vessel with mild proximal calcification. There is a very proximal first diagonal branch and septal perforator followed by a roughly 40% tubular ossified lesion before the vessel somewhat normalizes wraps around the apex perfusing the inferoapex. There is a second diagonal branch in the mid vessel  is also free of disease. The remainder of the LAD systems is free of significant disease.  D1: Small-caliber very proximal vessel, almost ramus intermedius. Angiographic normal.  D2: Moderate caliber vessel the mid LAD. Angiographically normal.  Left Circumflex: Large caliber, nondominant that bifurcates in the mid vessel into OM 1 and the AV groove circumflex was bifurcates into LPL 1&2. Both vessels are large in diameter and bifurcates into a smaller diameter branches. OM1 branches into a smaller superior branch and a larger inferior branch is very tortuous. LPL 1 and 2 are both moderate caliber.  Angiographically normal     RCA: Very large caliber, dominant vessel with a [place] bend. There are 2 small moderate caliber marginal branches. The vessel bifurcates distally into small to moderate caliber right posterior descending artery (RPDA) Right Posterior AV Groove Branch (RPAV).  Minimal luminal irregularities. _____________  History of Present Illness  48 y/o male with a h/o poorly controlled hypertension.  He was recently seen in the emergency department with progressive dyspnea and orthopnea.  His CXR showed bilateral diffuse interstitial thickening and periobronchial cuffing most concerning for bronchitits.  Pro BNP was elevated @ 3661 and LFT's were mildly elevated.  He was felt to be volume overloaded and was treated with IV lasix.  Admission was recommended however pt refused and he was thus advised to follow-up with cardiology.  He was seen in the office on 4/20 and continued to exhibit volume overload with complaints of dyspnea and orthopnea.  Decision was made to admit him for further evaluation.  Hospital Course  Following admission, Mr. Siddoway was placed on IV lasix with symptomatic improvement.  A 2D echocardiogram was performed on 4/20, revealing an EF of 30% with multiple wall motion abnormalities.  Given this finding, it was felt that he would require a right and left  heart cardiac catheterization to r/o obstructive CAD and assess his filling pressures.  He was maintained on IV lasix with symptomatic improvement and on April 23, he underwent diagnostic cardiac catheterization revealing nonobstructive coronary artery disease, normal cardiac output/index, and persistently elevated filling pressures with a pulmonary capillary wedge pressure of 33 mm mercury. It was felt that he would require further diuresis. In addition, his medications were optimized and he was placed on carvedilol, BiDil, and ACE inhibitor therapy. His renal function has remained stable with diuresis.   For the entire admission, he is -6.8 L and weight has been reduced from 200 pounds on admission to 186 pounds on discharge.  He has been ambulating without symptoms or limitations and has been counseled on the importance of medication adherence, dietary sodium restriction, daily weights, and symptom reporting. He has also been counseled on the importance of limiting alcohol intake and smoking cessation. He will be discharged home today in good condition and we will arrange for followup within 7 days in the office at which time we will also repeat a basic metabolic profile.   Discharge Vitals Blood pressure 144/96, pulse 96, temperature 98.1 F (36.7 C), temperature source Oral, resp. rate 18, height 5\' 10"  (1.778 m), weight 186 lb 8 oz (84.596 kg), SpO2 98.00%.  Filed Weights   03/28/14 0500 03/29/14 0708 03/31/14 0457  Weight: 194 lb (87.998 kg) 187 lb 14.4 oz (85.231 kg) 186 lb 8 oz (84.596 kg)   Labs  CBC  Recent Labs  03/29/14 1930  WBC 7.5  HGB 14.5  HCT 42.7  MCV 88.2  PLT 210   Basic Metabolic Panel  Recent Labs  03/29/14 0524 03/29/14 1930 03/31/14 0610  NA 138  --  140  K 4.0  --  3.9  CL 101  --  103  CO2 24  --  24  GLUCOSE 112*  --  105*  BUN 17  --  16  CREATININE 1.46* 1.30 1.30  CALCIUM 8.7  --  8.6   Liver Function Tests Lab Results  Component Value Date    ALT 116* 03/26/2014   AST 60* 03/26/2014   ALKPHOS 89 03/26/2014   BILITOT 0.8 03/26/2014    Cardiac Enzymes Lab Results  Component Value Date   TROPONINI <0.30 03/27/2014    Fasting Lipid Panel Lab Results  Component Value Date   CHOL 111 03/27/2014   HDL 39* 03/27/2014   LDLCALC 56 03/27/2014   TRIG 81 03/27/2014   CHOLHDL 2.8 03/27/2014    Thyroid Function Tests Lab Results  Component Value Date   TSH 2.710 03/26/2014    Disposition  Pt is being discharged home today in good condition.  Follow-up Plans & Appointments  Follow-up Information   Follow up with Olga Millers, MD. (we  will arrange for f/u and contact you this week.)    Specialty:  Cardiology   Contact information:   [address] [place] [address] [phone]       [facility] with [facility]. (as scheduled.)      Discharge Medications    Medication List         aspirin 81 MG EC tablet  Take 1 tablet (81 mg total) by mouth daily.     atorvastatin 10 MG tablet  Commonly known as:  LIPITOR  Take 1 tablet (10 mg total) by mouth daily.     carvedilol 6.25 MG tablet  Commonly known as:  COREG  Take 1 tablet (6.25 mg total) by mouth 2 (two) times daily with a meal.     furosemide 40 MG tablet  Commonly known as:  LASIX  Take 1 tablet (40 mg total) by mouth 2 (two) times daily.     isosorbide-hydrALAZINE 20-37.5 MG per tablet  Commonly known as:  BIDIL  Take 1 tablet by mouth 2 (two) times daily.     lisinopril 40 MG tablet  Commonly known as:  PRINIVIL,ZESTRIL  Take 1 tablet (40 mg total) by mouth daily.       Outstanding Labs/Studies  Follow-up BMET within 1 week.  Duration of Discharge Encounter   Greater than 30 minutes including physician time.  Signed, Ok Anishristopher R Berge NP 03/31/2014, 8:04 AM   Agree with above discharge summary. Patient seen and examined

## 2014-03-31 NOTE — Discharge Instructions (Signed)
***  PLEASE REMEMBER TO BRING ALL OF YOUR MEDICATIONS TO EACH OF YOUR FOLLOW-UP OFFICE VISITS.  

## 2014-04-03 ENCOUNTER — Telehealth: Payer: Self-pay | Admitting: Cardiology

## 2014-04-03 ENCOUNTER — Encounter: Payer: Self-pay | Admitting: *Deleted

## 2014-04-03 NOTE — Telephone Encounter (Signed)
New message     Pt released from hosp Saturday.  However, he is lying around sleeping all of the time, coughing, cold all of the time.  Mother want to talk to a nurse she is concerned

## 2014-04-03 NOTE — Telephone Encounter (Signed)
Spoke with pt mother, she reports the pt is having SOB with any exertion. He cont to have a cough. She also reports the pt is c/o being cold all the time. Pt is due to be seen this week for his hosp f/u. Pt will come to [place] and see dr Jens Som tomorrow

## 2014-04-04 ENCOUNTER — Inpatient Hospital Stay (HOSPITAL_COMMUNITY): Payer: Non-veteran care

## 2014-04-04 ENCOUNTER — Ambulatory Visit (HOSPITAL_COMMUNITY): Payer: Non-veteran care

## 2014-04-04 ENCOUNTER — Inpatient Hospital Stay (HOSPITAL_COMMUNITY)
Admission: AD | Admit: 2014-04-04 | Discharge: 2014-04-08 | DRG: 291 | Disposition: A | Discharge status: Home / Self Care | Payer: Non-veteran care | Source: Ambulatory Visit | Attending: Internal Medicine | Admitting: Internal Medicine

## 2014-04-04 ENCOUNTER — Encounter (HOSPITAL_COMMUNITY): Payer: Self-pay | Admitting: *Deleted

## 2014-04-04 ENCOUNTER — Encounter: Payer: Self-pay | Admitting: Cardiology

## 2014-04-04 ENCOUNTER — Ambulatory Visit (INDEPENDENT_AMBULATORY_CARE_PROVIDER_SITE_OTHER): Payer: Non-veteran care | Admitting: Cardiology

## 2014-04-04 VITALS — BP 112/80 | HR 114 | Temp 98.6°F | Ht 70.0 in | Wt 187.0 lb

## 2014-04-04 DIAGNOSIS — Y95 Nosocomial condition: Secondary | ICD-10-CM

## 2014-04-04 DIAGNOSIS — E119 Type 2 diabetes mellitus without complications: Secondary | ICD-10-CM | POA: Diagnosis present

## 2014-04-04 DIAGNOSIS — R7989 Other specified abnormal findings of blood chemistry: Secondary | ICD-10-CM

## 2014-04-04 DIAGNOSIS — E871 Hypo-osmolality and hyponatremia: Secondary | ICD-10-CM

## 2014-04-04 DIAGNOSIS — R05 Cough: Secondary | ICD-10-CM | POA: Diagnosis present

## 2014-04-04 DIAGNOSIS — J189 Pneumonia, unspecified organism: Secondary | ICD-10-CM | POA: Diagnosis present

## 2014-04-04 DIAGNOSIS — I129 Hypertensive chronic kidney disease with stage 1 through stage 4 chronic kidney disease, or unspecified chronic kidney disease: Secondary | ICD-10-CM | POA: Diagnosis present

## 2014-04-04 DIAGNOSIS — R0602 Shortness of breath: Secondary | ICD-10-CM

## 2014-04-04 DIAGNOSIS — F1010 Alcohol abuse, uncomplicated: Secondary | ICD-10-CM | POA: Diagnosis present

## 2014-04-04 DIAGNOSIS — I5021 Acute systolic (congestive) heart failure: Secondary | ICD-10-CM

## 2014-04-04 DIAGNOSIS — Z720 Tobacco use: Secondary | ICD-10-CM

## 2014-04-04 DIAGNOSIS — N179 Acute kidney failure, unspecified: Secondary | ICD-10-CM | POA: Diagnosis present

## 2014-04-04 DIAGNOSIS — I10 Essential (primary) hypertension: Secondary | ICD-10-CM

## 2014-04-04 DIAGNOSIS — N182 Chronic kidney disease, stage 2 (mild): Secondary | ICD-10-CM | POA: Diagnosis present

## 2014-04-04 DIAGNOSIS — K219 Gastro-esophageal reflux disease without esophagitis: Secondary | ICD-10-CM | POA: Diagnosis present

## 2014-04-04 DIAGNOSIS — I2510 Atherosclerotic heart disease of native coronary artery without angina pectoris: Secondary | ICD-10-CM | POA: Diagnosis present

## 2014-04-04 DIAGNOSIS — I509 Heart failure, unspecified: Secondary | ICD-10-CM

## 2014-04-04 DIAGNOSIS — I5023 Acute on chronic systolic (congestive) heart failure: Secondary | ICD-10-CM | POA: Diagnosis present

## 2014-04-04 DIAGNOSIS — I119 Hypertensive heart disease without heart failure: Secondary | ICD-10-CM

## 2014-04-04 DIAGNOSIS — I428 Other cardiomyopathies: Secondary | ICD-10-CM | POA: Diagnosis present

## 2014-04-04 DIAGNOSIS — E7800 Pure hypercholesterolemia, unspecified: Secondary | ICD-10-CM | POA: Diagnosis present

## 2014-04-04 DIAGNOSIS — D72829 Elevated white blood cell count, unspecified: Secondary | ICD-10-CM | POA: Diagnosis present

## 2014-04-04 DIAGNOSIS — F17200 Nicotine dependence, unspecified, uncomplicated: Secondary | ICD-10-CM | POA: Diagnosis present

## 2014-04-04 DIAGNOSIS — Z77090 Contact with and (suspected) exposure to asbestos: Secondary | ICD-10-CM

## 2014-04-04 DIAGNOSIS — R945 Abnormal results of liver function studies: Secondary | ICD-10-CM

## 2014-04-04 DIAGNOSIS — R059 Cough, unspecified: Secondary | ICD-10-CM | POA: Diagnosis present

## 2014-04-04 DIAGNOSIS — Z79899 Other long term (current) drug therapy: Secondary | ICD-10-CM

## 2014-04-04 DIAGNOSIS — Z7982 Long term (current) use of aspirin: Secondary | ICD-10-CM | POA: Diagnosis not present

## 2014-04-04 DIAGNOSIS — I5022 Chronic systolic (congestive) heart failure: Secondary | ICD-10-CM

## 2014-04-04 LAB — BASIC METABOLIC PANEL
BUN: 20 mg/dL (ref 6–23)
CALCIUM: 8.6 mg/dL (ref 8.4–10.5)
CO2: 21 meq/L (ref 19–32)
Chloride: 94 mEq/L — ABNORMAL LOW (ref 96–112)
Creatinine, Ser: 1.35 mg/dL (ref 0.50–1.35)
GFR calc Af Amer: 71 mL/min — ABNORMAL LOW (ref >90)
GFR calc non Af Amer: 61 mL/min — ABNORMAL LOW (ref >90)
Glucose, Bld: 112 mg/dL — ABNORMAL HIGH (ref 70–99)
Potassium: 4.7 mEq/L (ref 3.7–5.3)
Sodium: 130 mEq/L — ABNORMAL LOW (ref 137–147)

## 2014-04-04 LAB — CBC WITH DIFFERENTIAL/PLATELET
BASOS ABS: 0 10*3/uL (ref 0.0–0.1)
BASOS PCT: 0 % (ref 0–1)
EOS ABS: 0 10*3/uL (ref 0.0–0.7)
EOS PCT: 0 % (ref 0–5)
HCT: 40.3 % (ref 39.0–52.0)
Hemoglobin: 13.8 g/dL (ref 13.0–17.0)
LYMPHS PCT: 4 % — AB (ref 12–46)
Lymphs Abs: 0.8 10*3/uL (ref 0.7–4.0)
MCH: 29.7 pg (ref 26.0–34.0)
MCHC: 34.2 g/dL (ref 30.0–36.0)
MCV: 86.9 fL (ref 78.0–100.0)
MONO ABS: 1.1 10*3/uL — AB (ref 0.1–1.0)
Monocytes Relative: 6 % (ref 3–12)
Neutro Abs: 16.4 10*3/uL — ABNORMAL HIGH (ref 1.7–7.7)
Neutrophils Relative %: 90 % — ABNORMAL HIGH (ref 43–77)
PLATELETS: 176 10*3/uL (ref 150–400)
RBC: 4.64 MIL/uL (ref 4.22–5.81)
RDW: 12.9 % (ref 11.5–15.5)
WBC: 18.3 10*3/uL — AB (ref 4.0–10.5)

## 2014-04-04 LAB — URINALYSIS, ROUTINE W REFLEX MICROSCOPIC
Bilirubin Urine: NEGATIVE
Glucose, UA: NEGATIVE mg/dL
Hgb urine dipstick: NEGATIVE
KETONES UR: NEGATIVE mg/dL
Leukocytes, UA: NEGATIVE
Nitrite: NEGATIVE
PROTEIN: NEGATIVE mg/dL
Specific Gravity, Urine: 1.009 (ref 1.005–1.030)
Urobilinogen, UA: 1 mg/dL (ref 0.0–1.0)
pH: 7 (ref 5.0–8.0)

## 2014-04-04 LAB — PRO B NATRIURETIC PEPTIDE: Pro B Natriuretic peptide (BNP): 2927 pg/mL — ABNORMAL HIGH (ref 0–125)

## 2014-04-04 MED ORDER — ASPIRIN EC 81 MG PO TBEC
81.0000 mg | DELAYED_RELEASE_TABLET | Freq: Every day | ORAL | Status: DC
  Administered 2014-04-05 – 2014-04-08 (×4): 81 mg via ORAL
  Filled 2014-04-04 (×5): qty 1

## 2014-04-04 MED ORDER — AZITHROMYCIN 500 MG PO TABS
500.0000 mg | ORAL_TABLET | Freq: Every day | ORAL | Status: AC
  Administered 2014-04-04: 500 mg via ORAL
  Filled 2014-04-04: qty 1

## 2014-04-04 MED ORDER — ISOSORB DINITRATE-HYDRALAZINE 20-37.5 MG PO TABS
1.0000 | ORAL_TABLET | Freq: Two times a day (BID) | ORAL | Status: DC
  Administered 2014-04-04 – 2014-04-08 (×8): 1 via ORAL
  Filled 2014-04-04 (×9): qty 1

## 2014-04-04 MED ORDER — PIPERACILLIN-TAZOBACTAM 3.375 G IVPB
3.3750 g | Freq: Three times a day (TID) | INTRAVENOUS | Status: DC
  Administered 2014-04-04 – 2014-04-05 (×2): 3.375 g via INTRAVENOUS
  Filled 2014-04-04 (×4): qty 50

## 2014-04-04 MED ORDER — ATORVASTATIN CALCIUM 10 MG PO TABS
10.0000 mg | ORAL_TABLET | Freq: Every day | ORAL | Status: DC
  Administered 2014-04-04: 10 mg via ORAL
  Filled 2014-04-04 (×2): qty 1

## 2014-04-04 MED ORDER — ONDANSETRON HCL 4 MG/2ML IJ SOLN
4.0000 mg | Freq: Four times a day (QID) | INTRAMUSCULAR | Status: DC | PRN
  Administered 2014-04-04 – 2014-04-07 (×6): 4 mg via INTRAVENOUS
  Filled 2014-04-04 (×5): qty 2

## 2014-04-04 MED ORDER — AZITHROMYCIN 250 MG PO TABS: 250.0000 mg | ORAL_TABLET | Freq: Every day | ORAL | Status: DC

## 2014-04-04 MED ORDER — FUROSEMIDE 10 MG/ML IJ SOLN
40.0000 mg | Freq: Two times a day (BID) | INTRAMUSCULAR | Status: DC
  Administered 2014-04-04: 40 mg via INTRAVENOUS
  Filled 2014-04-04 (×3): qty 4

## 2014-04-04 MED ORDER — AZITHROMYCIN 250 MG PO TABS: ORAL_TABLET | ORAL | Status: DC

## 2014-04-04 MED ORDER — LISINOPRIL 40 MG PO TABS
40.0000 mg | ORAL_TABLET | Freq: Every day | ORAL | Status: DC
  Administered 2014-04-05 – 2014-04-08 (×4): 40 mg via ORAL
  Filled 2014-04-04 (×4): qty 1

## 2014-04-04 MED ORDER — ONDANSETRON HCL 4 MG/2ML IJ SOLN
INTRAMUSCULAR | Status: AC
  Administered 2014-04-04: 4 mg via INTRAVENOUS
  Filled 2014-04-04: qty 2

## 2014-04-04 MED ORDER — POTASSIUM CHLORIDE CRYS ER 20 MEQ PO TBCR
20.0000 meq | EXTENDED_RELEASE_TABLET | Freq: Every day | ORAL | Status: DC
  Administered 2014-04-04 – 2014-04-05 (×2): 20 meq via ORAL
  Filled 2014-04-04 (×3): qty 1

## 2014-04-04 MED ORDER — CARVEDILOL 6.25 MG PO TABS
6.2500 mg | ORAL_TABLET | Freq: Two times a day (BID) | ORAL | Status: DC
  Administered 2014-04-04 – 2014-04-05 (×3): 6.25 mg via ORAL
  Filled 2014-04-04 (×6): qty 1

## 2014-04-04 MED ORDER — ACETAMINOPHEN 325 MG PO TABS
650.0000 mg | ORAL_TABLET | Freq: Four times a day (QID) | ORAL | Status: DC | PRN
  Administered 2014-04-04 – 2014-04-08 (×8): 650 mg via ORAL
  Filled 2014-04-04 (×8): qty 2

## 2014-04-04 MED ORDER — FUROSEMIDE 40 MG PO TABS: 80.0000 mg | ORAL_TABLET | Freq: Two times a day (BID) | ORAL | Status: AC

## 2014-04-04 MED ORDER — AZITHROMYCIN 250 MG PO TABS
250.0000 mg | ORAL_TABLET | Freq: Every day | ORAL | Status: DC
  Filled 2014-04-04: qty 1

## 2014-04-04 NOTE — H&P (Signed)
Mark Horton  04/04/2014 10:30 AM   Office Visit  MRN:  [id number]   Description: 48 year old male  Provider: Lewayne Bunting, MD  Department: [facility]         Diagnoses    SOB (shortness of breath)    -  Primary    786.05    Acute systolic congestive heart failure        428.21,  428.0    Non-ischemic cardiomyopathy        425.4    Essential hypertension        401.9    Abnormal LFTs        790.6      Reason for Visit    Follow-up    CHF         Progress Notes    Lewayne Bunting, MD at 04/04/2014  9:32 AM    Status: Signed                     HPI: 48 year old male for followup CHF. Recently seen in the office for new onset congestive heart failure. Patient admitted. Echocardiogram in April 2015 showed an ejection fraction of 30%. Cardiac catheterization in April of 2015 showed minimal nonobstructive coronary disease. Pulmonary capillary wedge pressure 33. CHF felt most likely secondary to hypertension. Patient placed on medications and treated with IV diuretics. There was significant improvement. Note liver functions mildly increased. TSH normal. Since discharge, The patient is complaining of dyspnea on exertion. He has a cough with blood-streaked sputum. His cough increases with lying flat. He has not had chest pain in his pedal edema has resolved. He had a fever of 100.9 last evening. He has had decreased appetite. No diarrhea or dysuria. He describes some soreness in his back and hips.    Current Outpatient Prescriptions   Medication  Sig  Dispense  Refill   .  aspirin EC 81 MG EC tablet  Take 1 tablet (81 mg total) by mouth daily.         Marland Kitchen  atorvastatin (LIPITOR) 10 MG tablet  Take 1 tablet (10 mg total) by mouth daily.   30 tablet   6   .  carvedilol (COREG) 6.25 MG tablet  Take 1 tablet (6.25 mg total) by mouth 2 (two) times daily with a meal.   60 tablet   6   .  furosemide (LASIX) 40 MG tablet  Take 1 tablet (40 mg total) by mouth 2 (two) times daily.    60 tablet   6   .  isosorbide-hydrALAZINE (BIDIL) 20-37.5 MG per tablet  Take 1 tablet by mouth 2 (two) times daily.   60 tablet   6   .  lisinopril (PRINIVIL,ZESTRIL) 40 MG tablet  Take 1 tablet (40 mg total) by mouth daily.   30 tablet   0       No current facility-administered medications for this visit.           Past Medical History   Diagnosis  Date   .  Hypertension     .  Complication of anesthesia         It does not last long enough " I wake up I feel everything "   .  Chronic systolic CHF (congestive heart failure)         a. 03/2013 Echo: EF 30%, diff HK, worse in inf/infsept/apical walls, PASP   .  NICM (nonischemic cardiomyopathy)  a. 03/2013 Echo: EF 30%.   Marland Kitchen.  CAD (coronary artery disease)  Non-obstructive       a. 03/2014 Cath: Elevated R heart pressures, LM nl, LAD 40p, D1/2 nl, LCX nl, RCA nl.   .  H/O hiatal hernia     .  GERD (gastroesophageal reflux disease)           Past Surgical History   Procedure  Laterality  Date   .  Cholecystectomy       .  Left knee surgery       .  Coronary angiogram    03/29/14       NL cors         History       Social History   .  Marital Status:  Legally Separated       Spouse Name:  N/A       Number of Children:  1   .  Years of Education:  N/A       Occupational History   .           Holiday representativeconstruction       Social History Main Topics   .  Smoking status:  Former Smoker -- 0.50 packs/day for 35 years       Types:  Cigarettes   .  Smokeless tobacco:  Never Used         Comment: Pt has not smoked since the monday before last.   .  Alcohol Use:  No   .  Drug Use:  No   .  Sexual Activity:  Not on file       Other Topics  Concern   .  Not on file       Social History Narrative   .  No narrative on file        ROS: Diminished appetite, Soreness in hips and back, blood-streaked sputum, low-grade fever but no dysphasia, odynophagia, melena, hematochezia, dysuria, hematuria, rash, seizure  activity, pedal edema, claudication. Remaining systems are negative.   Physical Exam: Well-developed well-nourished in mild distress.   Skin is warm and dry.   HEENT is normal.   Neck is supple.   Chest is clear to auscultation with normal expansion.   Cardiovascular exam is regular rate and rhythm.   Abdominal exam nontender or distended. No masses palpated. Extremities show no edema. neuro grossly intact                Acute systolic congestive heart failure - Lewayne BuntingBrian S Crenshaw, MD at 04/04/2014 11:03 AM    Status: Linus OrnEdited Related Problem: Acute systolic congestive heart failure    Patient is complaining of increased dyspnea since discharge. He is also describing blood-streaked sputum and low-grade fever. He has diminished appetite. We discussed readmission but he would prefer to avoid. I will increase Lasix to 80 mg by mouth twice a day. Check potassium, renal function and BNP today and repeat in one week. He does not have significant edema on examination. I will treat for possible bronchitis. Note he is afebrile at present. Check CBC. We will give a Z-Pak. Given recent hospitalization I will perform a VQ scan to exclude pulmonary embolus. Finally if his cough does not improve with the above measures I would favor changing his ACE inhibitor to an ARB. Patient states that if he does not improve he will agree to hospitalization in the next 24 hours.   Addendum-patient now agreeable to hospitalization. We will admit for  IV diuresis with Lasix 40 mg twice a day. I would still like to proceed with a VQ scan and add azithromycin for possible bronchitis. Further plans pending results of laboratories and x-ray.    Revision History       Date/Time User Action    > 04/04/2014 11:32 AM Lewayne Bunting, MD Edit      04/04/2014 11:22 AM Lewayne Bunting, MD Edit      04/04/2014 11:03 AM Lewayne Bunting, MD Create              Non-ischemic cardiomyopathy- cath 03/29/14 - Lewayne Bunting, MD at  04/04/2014 11:03 AM    Status: Written Related Problem: Non-ischemic cardiomyopathy- cath 03/29/14    Continue ACE inhibitor, hydralazine nitrates and beta blocker. Titrate medications in the future. Repeat echocardiogram in approximately 3 months to see if LV function has improved once blood pressure controlled. Cardiomyopathy seems most likely to be hypertensive mediated.         Essential hypertension - Lewayne Bunting, MD at 04/04/2014 11:04 AM    Status: Written Related Problem: Essential hypertension    Blood pressure much improved.         Abnormal LFTs - Lewayne Bunting, MD at 04/04/2014 11:05 AM    Status: Written Related Problem: Abnormal LFTs    Possibly secondary to passive congestion. Repeat.               Vital Signs Most recent update: 04/04/2014 10:29 AM by Nita Sells, CMA    BP Ht Wt BMI        112/80 5\' 10"  (1.778 m) 187 lb (84.823 kg) 26.83 kg/m2

## 2014-04-04 NOTE — Progress Notes (Signed)
ANTIBIOTIC CONSULT NOTE - INITIAL  Pharmacy Consult for Zosyn Indication: pneumonia  No Known Allergies  Patient Measurements: Height: 5\' 10"  (177.8 cm) Weight: 184 lb 6.4 oz (83.643 kg) (scale A) IBW/kg (Calculated) : 73   Vital Signs: Temp: 100.8 F (38.2 C) (04/29 1800) Temp src: Oral (04/29 1800) BP: 107/61 mmHg (04/29 1800) Pulse Rate: 96 (04/29 1800) Intake/Output from previous day:   Intake/Output from this shift:    Labs:  Recent Labs  04/04/14 1400  WBC 18.3*  HGB 13.8  PLT 176  CREATININE 1.35   Estimated Creatinine Clearance: 69.8 ml/min (by C-G formula based on Cr of 1.35). No results found for this basename: VANCOTROUGH, VANCOPEAK, VANCORANDOM, GENTTROUGH, GENTPEAK, GENTRANDOM, TOBRATROUGH, TOBRAPEAK, TOBRARND, AMIKACINPEAK, AMIKACINTROU, AMIKACIN,  in the last 72 hours   Microbiology: No results found for this or any previous visit (from the past 720 hour(s)).  Medical History: Past Medical History  Diagnosis Date  . Hypertension   . Complication of anesthesia     It does not last long enough " I wake up I feel everything "  . Chronic systolic CHF (congestive heart failure)     a. 03/2013 Echo: EF 30%, diff HK, worse in inf/infsept/apical walls, PASP  . NICM (nonischemic cardiomyopathy)     a. 03/2013 Echo: EF 30%.  Marland Kitchen CAD (coronary artery disease) Non-obstructive    a. 03/2014 Cath: Elevated R heart pressures, LM nl, LAD 40p, D1/2 nl, LCX nl, RCA nl.  . H/O hiatal hernia   . GERD (gastroesophageal reflux disease)      Assessment: 48yom admitted with fever Tm102, elevated WBC 18.3 and SOB.  CXR shows RLL pna and was started on azithromycin.  Pt had recent hospitalization for HF.  Plan to cover for HCAP.     Plan:  Zosyn 3.375 Gm IV Q8hr EI  Leota Sauers Pharm.D. CPP, BCPS Clinical Pharmacist [phone] 04/04/2014 7:08 PM

## 2014-04-04 NOTE — Assessment & Plan Note (Addendum)
Patient is complaining of increased dyspnea since discharge. He is also describing blood-streaked sputum and low-grade fever. He has diminished appetite. We discussed readmission but he would prefer to avoid. I will increase Lasix to 80 mg by mouth twice a day. Check potassium, renal function and BNP today and repeat in one week. He does not have significant edema on examination. I will treat for possible bronchitis. Note he is afebrile at present. Check CBC. We will give a Z-Pak. Given recent hospitalization I will perform a VQ scan to exclude pulmonary embolus. Finally if his cough does not improve with the above measures I would favor changing his ACE inhibitor to an ARB. Patient states that if he does not improve he will agree to hospitalization in the next 24 hours.  Addendum-patient now agreeable to hospitalization. We will admit for IV diuresis with Lasix 40 mg twice a day. I would still like to proceed with a VQ scan and add azithromycin for possible bronchitis. Further plans pending results of laboratories and x-ray.

## 2014-04-04 NOTE — Progress Notes (Signed)
HPI: 48 year old male for followup CHF. Recently seen in the office for new onset congestive heart failure. Patient admitted. Echocardiogram in April 2015 showed an ejection fraction of 30%. Cardiac catheterization in April of 2015 showed minimal nonobstructive coronary disease. Pulmonary capillary wedge pressure 33. CHF felt most likely secondary to hypertension. Patient placed on medications and treated with IV diuretics. There was significant improvement. Note liver functions mildly increased. TSH normal. Since discharge, The patient is complaining of dyspnea on exertion. He has a cough with blood-streaked sputum. His cough increases with lying flat. He has not had chest pain in his pedal edema has resolved. He had a fever of 100.9 last evening. He has had decreased appetite. No diarrhea or dysuria. He describes some soreness in his back and hips.   Current Outpatient Prescriptions  Medication Sig Dispense Refill  . aspirin EC 81 MG EC tablet Take 1 tablet (81 mg total) by mouth daily.      Marland Kitchen atorvastatin (LIPITOR) 10 MG tablet Take 1 tablet (10 mg total) by mouth daily.  30 tablet  6  . carvedilol (COREG) 6.25 MG tablet Take 1 tablet (6.25 mg total) by mouth 2 (two) times daily with a meal.  60 tablet  6  . furosemide (LASIX) 40 MG tablet Take 1 tablet (40 mg total) by mouth 2 (two) times daily.  60 tablet  6  . isosorbide-hydrALAZINE (BIDIL) 20-37.5 MG per tablet Take 1 tablet by mouth 2 (two) times daily.  60 tablet  6  . lisinopril (PRINIVIL,ZESTRIL) 40 MG tablet Take 1 tablet (40 mg total) by mouth daily.  30 tablet  0   No current facility-administered medications for this visit.     Past Medical History  Diagnosis Date  . Hypertension   . Complication of anesthesia     It does not last long enough " I wake up I feel everything "  . Chronic systolic CHF (congestive heart failure)     a. 03/2013 Echo: EF 30%, diff HK, worse in inf/infsept/apical walls, PASP  . NICM  (nonischemic cardiomyopathy)     a. 03/2013 Echo: EF 30%.  Marland Kitchen CAD (coronary artery disease) Non-obstructive    a. 03/2014 Cath: Elevated R heart pressures, LM nl, LAD 40p, D1/2 nl, LCX nl, RCA nl.  . H/O hiatal hernia   . GERD (gastroesophageal reflux disease)     Past Surgical History  Procedure Laterality Date  . Cholecystectomy    . Left knee surgery    . Coronary angiogram  03/29/14    NL cors    History   Social History  . Marital Status: Legally Separated    Spouse Name: N/A    Number of Children: 1  . Years of Education: N/A   Occupational History  .      Holiday representative   Social History Main Topics  . Smoking status: Former Smoker -- 0.50 packs/day for 35 years    Types: Cigarettes  . Smokeless tobacco: Never Used     Comment: Pt has not smoked since the monday before last.  . Alcohol Use: No  . Drug Use: No  . Sexual Activity: Not on file   Other Topics Concern  . Not on file   Social History Narrative  . No narrative on file    ROS: Diminished appetite, Soreness in hips and back, blood-streaked sputum, low-grade fever but no dysphasia, odynophagia, melena, hematochezia, dysuria, hematuria, rash, seizure activity, pedal edema, claudication. Remaining systems are  negative.  Physical Exam: Well-developed well-nourished in mild distress.  Skin is warm and dry.  HEENT is normal.  Neck is supple.  Chest is clear to auscultation with normal expansion.  Cardiovascular exam is regular rate and rhythm.  Abdominal exam nontender or distended. No masses palpated. Extremities show no edema. neuro grossly intact

## 2014-04-04 NOTE — Assessment & Plan Note (Signed)
Continue ACE inhibitor, hydralazine nitrates and beta blocker. Titrate medications in the future. Repeat echocardiogram in approximately 3 months to see if LV function has improved once blood pressure controlled. Cardiomyopathy seems most likely to be hypertensive mediated.

## 2014-04-04 NOTE — Assessment & Plan Note (Signed)
Blood pressure much improved 

## 2014-04-04 NOTE — Patient Instructions (Signed)
Your physician recommends that you schedule a follow-up appointment in: ONE WEEK IN [facility]  Your physician recommends that you schedule a follow-up appointment in: 6 WEEKS WITH DR CRENSHAW IN [place]  Your physician recommends that you HAVE LAB WORK TODAY  VQ SCAN AT [facility]  INCREASE FUROSEMIDE TO 80 MG TWICE DAILY= 2 TABLETS TWICE DAILY

## 2014-04-04 NOTE — Assessment & Plan Note (Signed)
Possibly secondary to passive congestion. Repeat.

## 2014-04-04 NOTE — Progress Notes (Signed)
Based on patient presentation, elevated WBCs, CXR findings, fever it is likely the patient has HAP.    I have added zosyn.    Wilburt Finlay, PA-C

## 2014-04-05 DIAGNOSIS — I10 Essential (primary) hypertension: Secondary | ICD-10-CM

## 2014-04-05 DIAGNOSIS — J189 Pneumonia, unspecified organism: Secondary | ICD-10-CM

## 2014-04-05 DIAGNOSIS — I428 Other cardiomyopathies: Secondary | ICD-10-CM

## 2014-04-05 DIAGNOSIS — Y95 Nosocomial condition: Secondary | ICD-10-CM

## 2014-04-05 DIAGNOSIS — E871 Hypo-osmolality and hyponatremia: Secondary | ICD-10-CM

## 2014-04-05 DIAGNOSIS — I5023 Acute on chronic systolic (congestive) heart failure: Principal | ICD-10-CM | POA: Diagnosis present

## 2014-04-05 DIAGNOSIS — R7989 Other specified abnormal findings of blood chemistry: Secondary | ICD-10-CM

## 2014-04-05 LAB — BASIC METABOLIC PANEL
BUN: 24 mg/dL — AB (ref 6–23)
CALCIUM: 8.5 mg/dL (ref 8.4–10.5)
CHLORIDE: 94 meq/L — AB (ref 96–112)
CO2: 20 mEq/L (ref 19–32)
CREATININE: 1.44 mg/dL — AB (ref 0.50–1.35)
GFR calc non Af Amer: 57 mL/min — ABNORMAL LOW (ref >90)
GFR, EST AFRICAN AMERICAN: 66 mL/min — AB (ref >90)
Glucose, Bld: 110 mg/dL — ABNORMAL HIGH (ref 70–99)
Potassium: 4.4 mEq/L (ref 3.7–5.3)
Sodium: 130 mEq/L — ABNORMAL LOW (ref 137–147)

## 2014-04-05 LAB — SODIUM, URINE, RANDOM: Sodium, Ur: <20 mEq/L

## 2014-04-05 LAB — HIV ANTIBODY (ROUTINE TESTING W REFLEX): HIV: NONREACTIVE

## 2014-04-05 LAB — STREP PNEUMONIAE URINARY ANTIGEN: Strep Pneumo Urinary Antigen: NEGATIVE

## 2014-04-05 LAB — OSMOLALITY: Osmolality: 272 mOsm/kg — ABNORMAL LOW (ref 275–300)

## 2014-04-05 MED ORDER — DEXTROSE 5 % IV SOLN
1.0000 g | Freq: Three times a day (TID) | INTRAVENOUS | Status: DC
  Administered 2014-04-05 – 2014-04-07 (×5): 1 g via INTRAVENOUS
  Filled 2014-04-05 (×7): qty 1

## 2014-04-05 MED ORDER — VANCOMYCIN HCL IN DEXTROSE 1-5 GM/200ML-% IV SOLN
1000.0000 mg | Freq: Two times a day (BID) | INTRAVENOUS | Status: DC
  Administered 2014-04-05 – 2014-04-07 (×4): 1000 mg via INTRAVENOUS
  Filled 2014-04-05 (×6): qty 200

## 2014-04-05 MED ORDER — DEXTROSE 5 % IV SOLN
1.0000 g | Freq: Two times a day (BID) | INTRAVENOUS | Status: DC
  Administered 2014-04-05: 1 g via INTRAVENOUS
  Filled 2014-04-05 (×2): qty 1

## 2014-04-05 NOTE — Clinical Documentation Improvement (Signed)
Possible Clinical Conditions?    _______Hyponatremia _______Other Condition _______Cannot Clinically Determine     Diagnostics: 4/30:  Sodium: 130 4/29:  Sodium: 129   Thank You, Marciano Sequin, Clinical Documentation Specialist:  [phone]  [facility]- Health Information Management

## 2014-04-05 NOTE — Progress Notes (Signed)
UR completed Maxxon Schwanke K. Cove Haydon, RN, BSN, MSHL, CCM  04/05/2014 12:32 PM

## 2014-04-05 NOTE — Progress Notes (Signed)
ANTIBIOTIC CONSULT NOTE - INITIAL  Pharmacy Consult for Vancomycin Indication: Suspected HCAP  No Known Allergies  Patient Measurements: Height: 5\' 10"  (177.8 cm) Weight: 182 lb 11.2 oz (82.872 kg) (scale A) IBW/kg (Calculated) : 73 Adjusted Body Weight:   Vital Signs: Temp: 101.4 F (38.6 C) (04/30 1549) Temp src: Oral (04/30 1549) BP: 109/65 mmHg (04/30 1549) Pulse Rate: 102 (04/30 1549) Intake/Output from previous day: 04/29 0701 - 04/30 0700 In: 876 [P.O.:776; IV Piggyback:100] Out: 950 [Urine:950] Intake/Output from this shift: Total I/O In: 650 [P.O.:600; IV Piggyback:50] Out: 300 [Urine:300]  Labs:  Recent Labs  04/04/14 1400 04/05/14 0550  WBC 18.3*  --   HGB 13.8  --   PLT 176  --   CREATININE 1.35 1.44*   Estimated Creatinine Clearance: 65.5 ml/min (by C-G formula based on Cr of 1.44). No results found for this basename: VANCOTROUGH, VANCOPEAK, VANCORANDOM, GENTTROUGH, GENTPEAK, GENTRANDOM, TOBRATROUGH, TOBRAPEAK, TOBRARND, AMIKACINPEAK, AMIKACINTROU, AMIKACIN,  in the last 72 hours   Microbiology: Recent Results (from the past 720 hour(s))  CULTURE, BLOOD (ROUTINE X 2)     Status: None   Collection Time    04/04/14  2:13 PM      Result Value Ref Range Status   Specimen Description BLOOD LEFT FOREARM   Final   Special Requests BOTTLES DRAWN AEROBIC ONLY 10CC   Final   Culture  Setup Time     Final   Value: 04/04/2014 18:42     Performed at [facility]   Culture     Final   Value:        BLOOD CULTURE RECEIVED NO GROWTH TO DATE CULTURE WILL BE HELD FOR 5 DAYS BEFORE ISSUING A FINAL NEGATIVE REPORT     Performed at [facility]   Report Status PENDING   Incomplete  CULTURE, BLOOD (ROUTINE X 2)     Status: None   Collection Time    04/04/14  2:55 PM      Result Value Ref Range Status   Specimen Description BLOOD LEFT ARM   Final   Special Requests BOTTLES DRAWN AEROBIC AND ANAEROBIC 3CC   Final   Culture  Setup Time     Final   Value: 04/04/2014 18:42     Performed at [facility]   Culture     Final   Value:        BLOOD CULTURE RECEIVED NO GROWTH TO DATE CULTURE WILL BE HELD FOR 5 DAYS BEFORE ISSUING A FINAL NEGATIVE REPORT     Performed at [facility]   Report Status PENDING   Incomplete    Medical History: Past Medical History  Diagnosis Date  . Hypertension   . Complication of anesthesia     It does not last long enough " I wake up I feel everything "  . Chronic systolic CHF (congestive heart failure)     a. 03/2013 Echo: EF 30%, diff HK, worse in inf/infsept/apical walls, PASP  . NICM (nonischemic cardiomyopathy)     a. 03/2013 Echo: EF 30%.  Marland Kitchen CAD (coronary artery disease) Non-obstructive    a. 03/2014 Cath: Elevated R heart pressures, LM nl, LAD 40p, D1/2 nl, LCX nl, RCA nl.  . H/O hiatal hernia   . GERD (gastroesophageal reflux disease)     Medications:  Prescriptions prior to admission  Medication Sig Dispense Refill  . aspirin EC 81 MG EC tablet Take 1 tablet (81 mg total) by mouth daily.      Marland Kitchen  atorvastatin (LIPITOR) 10 MG tablet Take 1 tablet (10 mg total) by mouth daily.  30 tablet  6  . carvedilol (COREG) 6.25 MG tablet Take 1 tablet (6.25 mg total) by mouth 2 (two) times daily with a meal.  60 tablet  6  . furosemide (LASIX) 40 MG tablet Take 2 tablets (80 mg total) by mouth 2 (two) times daily.  60 tablet  12  . isosorbide-hydrALAZINE (BIDIL) 20-37.5 MG per tablet Take 1 tablet by mouth 2 (two) times daily.  60 tablet  6  . lisinopril (PRINIVIL,ZESTRIL) 40 MG tablet Take 1 tablet (40 mg total) by mouth daily.  30 tablet  0  . azithromycin (ZITHROMAX Z-PAK) 250 MG tablet TAKE AS DIRECTED  6 each  0   Assessment: 48yom started on Azithromycin and Zosyn now broadened to Cefepime and Vancomycin for suspected HCAP and recurrent fevers (Tmax 101.4).  - Wt: 83kg - Crcl 65 ml/min - WBC 18.3  Goal of Therapy:  Vancomycin trough level 15-20 mcg/ml  Plan:  1.  Vancomycin 1g IV q12h (x 8 days) 2. Agree with changing Cefepime to 1g IV q8h 3. Monitor renal function, cultures, clinical course and [facility]  [place] Robert Feliz Herard [phone] 04/05/2014,5:13 PM

## 2014-04-05 NOTE — Progress Notes (Signed)
Note: This document was prepared with digital dictation and possible smart phrase technology. Any transcriptional errors that result from this process are unintentional.   Mark Horton YNW:[id number] DOB: Jul 16, 1966 DOA: 04/04/2014 PCP: No primary provider on file.  Brief narrative: 48 y/o CM, known h/o chronic ETOh use, newly diagnosed CHF-potenttially HTN mediated, Recent admission admitted by Cardiology 03/26/14 for Iv diuresis, Pro-[id number]-ECHO on that admission EF30%- ?Cardiac cath done =40% LAD ossification, serverely elevated LVEDP-thought to be NICM c preserved CO, was diuresed about 6.8 liter and d/c from hospital 03/31/14 He tells me that he was admitted in December 2000 1440 pneumonia at [facility] and was given a prescription for antibiotics but never filled the prescription and thinks he's been battling bronchitis pretty much on and off since he was last in the hospital. 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 He states that when he came to the hospital yesterday he was coughing and had blood-tinged mucus. This has since resolved since starting antibiotics He denies any myalgias, no blurred vision no double vision no burning in the urine no throat pain no unilateral weakness no seizures no falls no weakness on any one side of body Endorses overall fever and malaise Has not been able to eat or drink really for the past couple of days and has no appetite has only been drinking fluids. He was admitted overnight and noted to have a white count of 18.3 pro BNP 2927 random glucose 110 sodium 1:30 BUN/creatinine 24/1.44   Past medical history-As per Problem list Chart reviewed as below- review  Consultants:  Cardiology  transferred care to hospitalist service 4/30  Procedures:  Chest x-ray  Antibiotics:  Azithromycin 4/29-4/29  Zosyn 4/29-4/29  Cefepime 4/30  Vancomycin 4/30  Interim History: See above Fevers noted today spiked 103  Telemetry: Sinus tachycardia 100 range   Objective: Filed Vitals:   04/05/14 0152 04/05/14 0530 04/05/14 1008 04/05/14 1549  BP: 112/75 113/79 106/65 109/65  Pulse: 101 114 100 102  Temp: 100.5 F (38.1 C) 100.7 F (38.2 C) 101.4 F (38.6 C) 101.4 F (38.6 C)  TempSrc: Oral Oral Oral Oral  Resp: 18 20 20 22   Height:      Weight:  82.872 kg (182 lb 11.2 oz)    SpO2: 97% 97% 97% 97%    Intake/Output Summary (Last 24 hours) at 04/05/14 1635 Last data filed at 04/05/14 1500  Gross per 24 hour  Intake   1526 ml  Output   1250 ml  Net    276 ml    Exam:  General: alert pleasant oriented no apparent distress Cardiovascular: S1-S2 no murmur rub or gallop no JVD no bruit Respiratory: decreased fremitus right ostia lower lobes Abdomen: soft nontender nondistended Skinno lower extremity edema Neurointact  Data Reviewed: Basic Metabolic Panel:  Recent Labs Lab 03/29/14 1930  03/31/14 0610 04/04/14 1400 04/05/14 0550  NA  --   --  140 130* 130*  K  --   < > 3.9 4.7 4.4  CL  --   --  103 94* 94*  CO2  --   --  24 21 20   GLUCOSE  --   --  105* 112* 110*  BUN  --   --  16 20 24*  CREATININE 1.30  --  1.30 1.35 1.44*  CALCIUM  --   --  8.6 8.6 8.5  < > = values in this interval not displayed. Liver Function Tests: No results found for this basename: AST, ALT, ALKPHOS, BILITOT, PROT, ALBUMIN,  in the last 168 hours No results found for this basename: LIPASE, AMYLASE,  in the last 168 hours No results found for this basename: AMMONIA,  in the last 168 hours CBC:  Recent Labs Lab 03/29/14 1930 04/04/14 1400  WBC 7.5 18.3*  NEUTROABS  --  16.4*  HGB 14.5 13.8  HCT 42.7 40.3  MCV 88.2 86.9  PLT 210 176   Cardiac Enzymes: No results  found for this basename: CKTOTAL, CKMB, CKMBINDEX, TROPONINI,  in the last 168 hours BNP: No components found with this basename: POCBNP,  CBG: No results found for this basename: GLUCAP,  in the last 168 hours  Recent Results (from the past 240 hour(s))  CULTURE, BLOOD (ROUTINE X 2)     Status: None   Collection Time    04/04/14  2:13 PM      Result Value Ref Range Status   Specimen Description BLOOD LEFT FOREARM   Final   Special Requests BOTTLES DRAWN AEROBIC ONLY 10CC   Final   Culture  Setup Time     Final   Value: 04/04/2014 18:42     Performed at [facility]   Culture     Final   Value:        BLOOD CULTURE RECEIVED NO GROWTH TO DATE CULTURE WILL BE HELD FOR 5 DAYS BEFORE ISSUING A FINAL NEGATIVE REPORT     Performed at [facility]   Report Status PENDING   Incomplete  CULTURE, BLOOD (ROUTINE X 2)     Status: None   Collection Time    04/04/14  2:55 PM      Result Value Ref Range Status   Specimen Description BLOOD LEFT ARM   Final   Special Requests BOTTLES DRAWN AEROBIC AND ANAEROBIC 3CC   Final   Culture  Setup Time     Final   Value: 04/04/2014 18:42     Performed at [facility]   Culture     Final   Value:        BLOOD CULTURE RECEIVED NO GROWTH TO DATE CULTURE WILL BE HELD FOR 5 DAYS BEFORE ISSUING A FINAL NEGATIVE REPORT     Performed at [facility]   Report Status PENDING   Incomplete     Studies:              All Imaging reviewed and is as per above notation   Scheduled Meds: . aspirin EC  81 mg Oral Daily  . atorvastatin  10 mg Oral q1800  . carvedilol  6.25 mg Oral BID WC  . ceFEPime (MAXIPIME) IV  1 g Intravenous Q12H  . isosorbide-hydrALAZINE  1 tablet Oral BID  . lisinopril  40 mg Oral Daily   Continuous Infusions:    Assessment/Plan: 1. Healthcare associated pneumonia-just discharged from hospital therefore we will treat this as healthcare Associated state. Start vancomycin and cefepime.obtain streptococcal  pneumonia antigen, influenza, Legionella [especially because he does drink relatively large amounts of alcohol] if he spikes another temperature or fever we will culturex2, follow sputum cultures ordered 2. 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 Continue Coreg 6.25 twice a  day, Bidil 1 tab twice a day [may not be able to afford, substitute hydralazine and nitrate if needed], continue lisinopril cautiously 40 mg daily, continue aspirin 81 mg daily 3. Hypertension actually slightly hypotensive currently-monitor 4. Leukocytosis-secondary to #1 5. Euvolemic Hyponatremia-get urinary osmolality, urinary sodium-probably free water excess given he has only been drinking and not really been eating much for the past couple of days in addition to this he has been taking Lasix which was discontinued by cardiology 6. Diabetes mellitus?-Obtain A1c-No coverage right now 7. Altered LFTs-potentially has hepatic steatosis secondary to chronic alcohol use. Admits to drinking 240s daily 8. 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 Mildly volume depleted.monitor labs in a.m.  Discontinue ACE inhibitor if creatinine transfer per 9. Hypercholesterolemia-LDL 50 total cholesterol 111.  Does not need statin and we'll discontinue  Code Status: full code Family Communication: none present at the bedside Disposition Plan: inpatient   Pleas Koch, MD  [facility] Pager [phone] 04/05/2014, 4:35 PM    LOS: 1 day

## 2014-04-05 NOTE — Progress Notes (Signed)
Patient seen and examined and agree with Assessment and Plan by Wilburt Finlay PA-C.  Admitted with SOB, fever and hemoptysis. Chest xray with RLL infiltrate c/w PNA.  Remains febrile.  Mildly tachycardic from fever.  Has a history of DCM with EF 30% but agree that mildly elevated BNP is more consistent with PNA than from acute on chronic systolic CHF given his weight being below his baseline and creatinine increased.  Lasix on hold.

## 2014-04-05 NOTE — Care Management Note (Addendum)
  Page 2 of 2   04/05/2014     5:43:36 PM CARE MANAGEMENT NOTE 04/05/2014  Patient:  Mark Horton, Mark Horton   Account Number:  [id number]  Date Initiated:  04/05/2014  Documentation initiated by:  Donato Schultz  Subjective/Objective Assessment:   Admitted with CHF, temp, pneumonia     Action/Plan:   CM to follow for dispositon needs   Anticipated DC Date:  04/08/2014   Anticipated DC Plan:  HOME/SELF CARE      DC Planning Services  CM consult      Choice offered to / List presented to:             Status of service:  In process, will continue to follow Medicare Important Message given?  YES (If response is "NO", the following Medicare IM given date fields will be blank) Date Medicare IM given:  04/05/2014 Date Additional Medicare IM given:    Discharge Disposition:    Per UR Regulation:  Reviewed for med. necessity/level of care/duration of stay  If discussed at Long Length of Stay Meetings, dates discussed:    Comments:  Konya Fauble RN, BSN, MSHL, CCM  Nurse - Case Manager, (Unit [facility])  [phone]  04/05/2014 4 Day Bounce Back:   Current admission 04/04/2014 with CHF and fever 102.5 (CXR + Pneumonia). IV ABX and IV Lasix. Prior Admission 03/26/14 - 03/31/14  CHF Per Medical Advisor:  Dr. Jacky Kindle:  related/unpreventable - disease state.  Initial IM not administered CM provided. Social:  From home with disabled mother and grandmother. Separated 20+ years.   Patient states hx/o using "crack" States he would wishes to not use these drugs any longer. [facility] but states he wishes to change PCP Medications:  Brother has been asssiting with buying medications.  Hx/o [facility] services 05/2013 [facility]:  notified Leanna Battles of admission and confirmed the following: Non-Service Connected. Med History:  metoprolo 50mg  bid, Norvasc 10mg  qd, Lisinopril 40mg  1/2 tab qd amlodipine 10mg  qd, flomax, Prilosec [facility] Marg[facility] Ables SW:  Madelon Lips  [phone] ext [phone] Pharm:  Vilinda Boehringer [phone] fax (patient can pick up meds at [facility]) but will need to be ordered through [facility] location. CM provided update that patient would like a different PCP and substance abuse resource asssitance. Dispositon Plan:  Home / self care with [facility].

## 2014-04-05 NOTE — Progress Notes (Signed)
Subjective: Feeling a little better.  + hemoptysis  Objective: Vital signs in last 24 hours: Temp:  [98.6 F (37 C)-103 F (39.4 C)] 100.7 F (38.2 C) (04/30 0530) Pulse Rate:  [93-114] 114 (04/30 0530) Resp:  [18-22] 20 (04/30 0530) BP: (97-126)/(50-85) 113/79 mmHg (04/30 0530) SpO2:  [96 %-99 %] 97 % (04/30 0530) Weight:  [182 lb 11.2 oz (82.872 kg)-187 lb (84.823 kg)] 182 lb 11.2 oz (82.872 kg) (04/30 0530) Last BM Date: 04/04/14  Intake/Output from previous day: 04/29 0701 - 04/30 0700 In: 876 [P.O.:776; IV Piggyback:100] Out: 950 [Urine:950] Intake/Output this shift:    Medications Current Facility-Administered Medications  Medication Dose Route Frequency Provider Last Rate Last Dose  . acetaminophen (TYLENOL) tablet 650 mg  650 mg Oral Q6H PRN Wilburt Finlay, PA-C   650 mg at 04/05/14 0552  . aspirin EC tablet 81 mg  81 mg Oral Daily Wilburt Finlay, PA-C      . atorvastatin (LIPITOR) tablet 10 mg  10 mg Oral q1800 Wilburt Finlay, PA-C   10 mg at 04/04/14 1753  . azithromycin (ZITHROMAX) tablet 250 mg  250 mg Oral Daily Lewayne Bunting, MD      . carvedilol (COREG) tablet 6.25 mg  6.25 mg Oral BID WC Wilburt Finlay, PA-C   6.25 mg at 04/05/14 1700  . furosemide (LASIX) injection 40 mg  40 mg Intravenous BID Wilburt Finlay, PA-C   40 mg at 04/04/14 1753  . isosorbide-hydrALAZINE (BIDIL) 20-37.5 MG per tablet 1 tablet  1 tablet Oral BID Wilburt Finlay, PA-C   1 tablet at 04/04/14 2208  . lisinopril (PRINIVIL,ZESTRIL) tablet 40 mg  40 mg Oral Daily Wilburt Finlay, PA-C      . ondansetron [facility]) injection 4 mg  4 mg Intravenous Q6H PRN Nada Boozer, NP   4 mg at 04/04/14 2342  . piperacillin-tazobactam (ZOSYN) IVPB 3.375 g  3.375 g Intravenous Q8H Lewayne Bunting, MD   3.375 g at 04/05/14 0400  . potassium chloride SA (K-DUR,KLOR-CON) CR tablet 20 mEq  20 mEq Oral Daily Wilburt Finlay, PA-C   20 mEq at 04/04/14 1636    PE: General appearance: alert, cooperative and no distress Lungs: Mild  left crackles Heart: regular rate and rhythm, S1, S2 normal, no murmur, click, rub or gallop Extremities: No LEE Pulses: 2+ and symmetric Skin: Warm and dry Neurologic: Grossly normal  Lab Results:   Recent Labs  04/04/14 1400  WBC 18.3*  HGB 13.8  HCT 40.3  PLT 176   BMET  Recent Labs  04/04/14 1400 04/05/14 0550  NA 130* 130*  K 4.7 4.4  CL 94* 94*  CO2 21 20  GLUCOSE 112* 110*  BUN 20 24*  CREATININE 1.35 1.44*  CALCIUM 8.6 8.5     Assessment/Plan   Principal Problem:   Hospital acquired PNA  Started zosyn yesterday but after discussing with IM, will change to cefepime, 1G Q12.  CXR shows right LL infiltrate.  Active Problems:     Acute on chronic systolic heart failure Net fluids:  -0.074L.   He is below his discharge weight from the other day.  I think this is mostly HCAP. Despite the elevated BNP.   SCr has increased .  DCing lasix.  Watch I/O's  Restart lasix at discharge or sooner if needed.  Some tachycardia on tele.  Mid 80's now.    Also holding off on VQ scan for now.        Non-ischemic cardiomyopathy-  cath 03/29/14   Essential hypertension  BP stable.     LOS: 1 day    Wilburt FinlayBryan Jireh Vinas PA-C 04/05/2014 7:59 AM

## 2014-04-06 DIAGNOSIS — E871 Hypo-osmolality and hyponatremia: Secondary | ICD-10-CM

## 2014-04-06 DIAGNOSIS — I119 Hypertensive heart disease without heart failure: Secondary | ICD-10-CM

## 2014-04-06 DIAGNOSIS — I5022 Chronic systolic (congestive) heart failure: Secondary | ICD-10-CM

## 2014-04-06 DIAGNOSIS — R7989 Other specified abnormal findings of blood chemistry: Secondary | ICD-10-CM

## 2014-04-06 LAB — CBC WITH DIFFERENTIAL/PLATELET
BASOS PCT: 0 % (ref 0–1)
Basophils Absolute: 0 10*3/uL (ref 0.0–0.1)
EOS ABS: 0.1 10*3/uL (ref 0.0–0.7)
Eosinophils Relative: 1 % (ref 0–5)
HCT: 36.4 % — ABNORMAL LOW (ref 39.0–52.0)
Hemoglobin: 12 g/dL — ABNORMAL LOW (ref 13.0–17.0)
Lymphocytes Relative: 9 % — ABNORMAL LOW (ref 12–46)
Lymphs Abs: 1 10*3/uL (ref 0.7–4.0)
MCH: 28.8 pg (ref 26.0–34.0)
MCHC: 33 g/dL (ref 30.0–36.0)
MCV: 87.3 fL (ref 78.0–100.0)
MONOS PCT: 7 % (ref 3–12)
Monocytes Absolute: 0.8 10*3/uL (ref 0.1–1.0)
NEUTROS PCT: 83 % — AB (ref 43–77)
Neutro Abs: 9.2 10*3/uL — ABNORMAL HIGH (ref 1.7–7.7)
PLATELETS: 179 10*3/uL (ref 150–400)
RBC: 4.17 MIL/uL — ABNORMAL LOW (ref 4.22–5.81)
RDW: 12.9 % (ref 11.5–15.5)
WBC: 11.1 10*3/uL — ABNORMAL HIGH (ref 4.0–10.5)

## 2014-04-06 LAB — COMPREHENSIVE METABOLIC PANEL
ALBUMIN: 2.1 g/dL — AB (ref 3.5–5.2)
ALT: 64 U/L — ABNORMAL HIGH (ref 0–53)
AST: 82 U/L — ABNORMAL HIGH (ref 0–37)
Alkaline Phosphatase: 68 U/L (ref 39–117)
BUN: 26 mg/dL — AB (ref 6–23)
CO2: 20 mEq/L (ref 19–32)
Calcium: 8.2 mg/dL — ABNORMAL LOW (ref 8.4–10.5)
Chloride: 92 mEq/L — ABNORMAL LOW (ref 96–112)
Creatinine, Ser: 1.32 mg/dL (ref 0.50–1.35)
GFR calc Af Amer: 73 mL/min — ABNORMAL LOW (ref >90)
GFR calc non Af Amer: 63 mL/min — ABNORMAL LOW (ref >90)
Glucose, Bld: 103 mg/dL — ABNORMAL HIGH (ref 70–99)
Potassium: 4.1 mEq/L (ref 3.7–5.3)
SODIUM: 127 meq/L — AB (ref 137–147)
TOTAL PROTEIN: 5.9 g/dL — AB (ref 6.0–8.3)
Total Bilirubin: 3.4 mg/dL — ABNORMAL HIGH (ref 0.3–1.2)

## 2014-04-06 LAB — LEGIONELLA ANTIGEN, URINE: LEGIONELLA ANTIGEN, URINE: NEGATIVE

## 2014-04-06 LAB — HEMOGLOBIN A1C
HEMOGLOBIN A1C: 5.5 % (ref <5.7)
MEAN PLASMA GLUCOSE: 111 mg/dL (ref <117)

## 2014-04-06 LAB — INFLUENZA PANEL BY PCR (TYPE A & B)
H1N1 flu by pcr: NOT DETECTED
Influenza A By PCR: NEGATIVE
Influenza B By PCR: NEGATIVE

## 2014-04-06 LAB — OSMOLALITY, URINE: OSMOLALITY UR: 757 mosm/kg (ref 390–1090)

## 2014-04-06 MED ORDER — CARVEDILOL 6.25 MG PO TABS
6.2500 mg | ORAL_TABLET | Freq: Two times a day (BID) | ORAL | Status: DC
  Administered 2014-04-06 – 2014-04-08 (×5): 6.25 mg via ORAL
  Filled 2014-04-06 (×7): qty 1

## 2014-04-06 MED ORDER — ATORVASTATIN CALCIUM 10 MG PO TABS
10.0000 mg | ORAL_TABLET | Freq: Every day | ORAL | Status: DC
  Administered 2014-04-06 – 2014-04-07 (×2): 10 mg via ORAL
  Filled 2014-04-06 (×3): qty 1

## 2014-04-06 MED ORDER — METOPROLOL TARTRATE 1 MG/ML IV SOLN: 2.5000 mg | Freq: Four times a day (QID) | INTRAVENOUS | Status: DC | PRN

## 2014-04-06 MED ORDER — SODIUM CHLORIDE 0.9 % IV SOLN: INTRAVENOUS | Status: DC

## 2014-04-06 NOTE — Progress Notes (Signed)
SUBJECTIVE:  Still with cough  OBJECTIVE:   Vitals:   Filed Vitals:   04/05/14 2115 04/06/14 0140 04/06/14 0427 04/06/14 0537  BP: 104/75 109/71  102/66  Pulse: 95 100  95  Temp: 99.3 F (37.4 C) 100.9 F (38.3 C) 100 F (37.8 C) 99 F (37.2 C)  TempSrc: Oral Oral Oral Oral  Resp: 20 22  22   Height:      Weight:    184 lb 3.2 oz (83.553 kg)  SpO2: 99% 98%  97%   I&O's:   Intake/Output Summary (Last 24 hours) at 04/06/14 1120 Last data filed at 04/06/14 0900  Gross per 24 hour  Intake    480 ml  Output   1300 ml  Net   -820 ml   TELEMETRY: Reviewed telemetry pt in NSR:     PHYSICAL EXAM General: Well developed, well nourished, in no acute distress Head: Eyes PERRLA, No xanthomas.   Normal cephalic and atramatic  Lungs:   Crackles at right base Heart:   HRRR S1 S2 Pulses are 2+ & equal. Abdomen: Bowel sounds are positive, abdomen soft and non-tender without masses Extremities:   No clubbing, cyanosis or edema.  DP +1 Neuro: Alert and oriented X 3. Psych:  Good affect, responds appropriately   LABS: Basic Metabolic Panel:  Recent Labs  02/58/52 0550 04/06/14 0615  NA 130* 127*  K 4.4 4.1  CL 94* 92*  CO2 20 20  GLUCOSE 110* 103*  BUN 24* 26*  CREATININE 1.44* 1.32  CALCIUM 8.5 8.2*   Liver Function Tests:  Recent Labs  04/06/14 0615  AST 82*  ALT 64*  ALKPHOS 68  BILITOT 3.4*  PROT 5.9*  ALBUMIN 2.1*   No results found for this basename: LIPASE, AMYLASE,  in the last 72 hours CBC:  Recent Labs  04/04/14 1400 04/06/14 0615  WBC 18.3* 11.1*  NEUTROABS 16.4* 9.2*  HGB 13.8 12.0*  HCT 40.3 36.4*  MCV 86.9 87.3  PLT 176 179   Cardiac Enzymes: No results found for this basename: CKTOTAL, CKMB, CKMBINDEX, TROPONINI,  in the last 72 hours BNP: No components found with this basename: POCBNP,  D-Dimer: No results found for this basename: DDIMER,  in the last 72 hours Hemoglobin A1C: No results found for this basename: HGBA1C,  in the  last 72 hours Fasting Lipid Panel: No results found for this basename: CHOL, HDL, LDLCALC, TRIG, CHOLHDL, LDLDIRECT,  in the last 72 hours Thyroid Function Tests: No results found for this basename: TSH, T4TOTAL, FREET3, T3FREE, THYROIDAB,  in the last 72 hours Anemia Panel: No results found for this basename: VITAMINB12, FOLATE, FERRITIN, TIBC, IRON, RETICCTPCT,  in the last 72 hours Coag Panel:   Lab Results  Component Value Date   INR 1.30 03/26/2014    RADIOLOGY: Dg Chest 2 View  04/04/2014   CLINICAL DATA:  Fever.  Shortness of breath.  EXAM: CHEST  2 VIEW  COMPARISON:  DG CHEST 2 VIEW dated 03/26/2014; DG CHEST 2V dated 12/02/2013  FINDINGS: Mediastinum and hilar structures are normal. Cardiomegaly with normal pulmonary vascularity is present. Cardiomegaly is stable from 03/26/2014 but increased from 12/02/2013. Right lower lobe infiltrate noted most consistent with pneumonia. Asymmetric pulmonary edema is less likely. No pleural effusion or pneumothorax. No acute bony abnormality.  IMPRESSION: 1. Right lower lobe prominent infiltrate consistent with pneumonia. 2. Cardiomegaly, cardiac ECHO is suggested for further evaluation.   Electronically Signed   By: Maisie Fus  Register   On: 04/04/2014  16:28   Dg Chest 2 View  03/26/2014   CLINICAL DATA:  New CHF.  History of hypertension.  Smoker.  EXAM: CHEST  2 VIEW  COMPARISON:  03/22/2014  FINDINGS: Mild cardiomegaly. No confluent opacities in the lungs. No effusions or edema. No acute bony abnormality.  IMPRESSION: Cardiomegaly.  No active disease.   Electronically Signed   By: Charlett NoseKevin  Dover M.D.   On: 03/26/2014 17:26   Dg Chest 2 View  03/22/2014   CLINICAL DATA:  Chest pain, shortness of breath  EXAM: CHEST  2 VIEW  COMPARISON:  DG CHEST 2V dated 12/02/2013  FINDINGS: Bilateral diffuse interstitial thickening and peribronchial cuffing most concerning for bronchitis. There is no focal parenchymal opacity, pleural effusion, or pneumothorax. The  heart and mediastinal contours are unremarkable.  The osseous structures are unremarkable.  IMPRESSION: Bilateral diffuse interstitial thickening and peribronchial cuffing most concerning for bronchitis.   Electronically Signed   By: Elige KoHetal  Patel   On: 03/22/2014 21:18     Assessment/Plan  Principal Problem:  Hospital acquired PNA - on antibiotics per Hospitalist Active Problems:  Chronic systolic heart failure - appears compensated and probably volume depleted based on increase creatinine and lower sodium.  He has had a poor appetite for several days.  Lasix on hold.  Net fluids: -0.074L. He is below his discharge weight from the other day. I think this is mostly HCAP. Despite the elevated BNP. SCr has increased .  Watch I/O's Restart lasix at discharge or sooner if needed.  Non-ischemic cardiomyopathy- cath 03/29/14 normal coronary arteries EF 30% Essential hypertension BP stable.  No new recs at this time except restart home lasix dose at discharge or sooner if needed.  Will sign off.  Call with any questions.  Quintella Reichertraci R Turner, MD  04/06/2014  11:20 AM

## 2014-04-06 NOTE — Progress Notes (Signed)
Right after speaking with MD pt HR went back down to 90's.  EKG obtained.  Pt stated he did not want to take the metoprolol since his HR went back down.  Will continue to monitor.

## 2014-04-06 NOTE — Progress Notes (Signed)
Pt HR is sustaining 140's, pt say's "he felt it".  Paged MD's.

## 2014-04-06 NOTE — Progress Notes (Signed)
[facility] PROGRESS NOTE Assessment/Plan: Hospital acquired PNA - On vanc and cefepime 4.29.2015. - HIV negative, urine legionellae pending. - Still spiking fevers, BC. Defervecing.  Hyponatremia: - Urine sodium < 20. Most likely pre-renal. - Hold diuretics. Cont oral hydration.  AKI: - Now resolved with oral hydration. - Cont ACE-I.  Chronic systolic heart failure/ Non-ischemic cardiomyopathy- cath 03/29/14/ echo 4.20.2015: LVEF is 30% with diffuse hypokinesis: - Showed CAD non-obstructive. - Now Euvolemic, cont to hold diuretics, Bp low. - cont ACE-I, coreg and BiDil.  Essential hypertension - borderline low.    Code Status: full code  Family Communication: none present at the bedside  Disposition Plan: inpatient    Consultants:  none  Procedures:  CXR  Antibiotics:  vanc and cefepime 4.30.2015  HPI/Subjective: Relates he does feels better  Objective: Filed Vitals:   04/05/14 2115 04/06/14 0140 04/06/14 0427 04/06/14 0537  BP: 104/75 109/71  102/66  Pulse: 95 100  95  Temp: 99.3 F (37.4 C) 100.9 F (38.3 C) 100 F (37.8 C) 99 F (37.2 C)  TempSrc: Oral Oral Oral Oral  Resp: 20 22  22   Height:      Weight:    83.553 kg (184 lb 3.2 oz)  SpO2: 99% 98%  97%    Intake/Output Summary (Last 24 hours) at 04/06/14 0801 Last data filed at 04/06/14 0430  Gross per 24 hour  Intake    890 ml  Output   1300 ml  Net   -410 ml   Filed Weights   04/04/14 1300 04/05/14 0530 04/06/14 0537  Weight: 83.643 kg (184 lb 6.4 oz) 82.872 kg (182 lb 11.2 oz) 83.553 kg (184 lb 3.2 oz)    Exam:  General: Alert, awake, oriented x3, in no acute distress.  HEENT: No bruits, no goiter. -JVD Heart: Regular rate and rhythm. Lungs: Good air movement, crackles on the left. Abdomen: Soft, nontender, nondistended, positive bowel sounds.     Data Reviewed: Basic Metabolic Panel:  Recent Labs Lab 03/31/14 0610 04/04/14 1400 04/05/14 0550 04/06/14 0615    NA 140 130* 130* 127*  K 3.9 4.7 4.4 4.1  CL 103 94* 94* 92*  CO2 24 21 20 20   GLUCOSE 105* 112* 110* 103*  BUN 16 20 24* 26*  CREATININE 1.30 1.35 1.44* 1.32  CALCIUM 8.6 8.6 8.5 8.2*   Liver Function Tests:  Recent Labs Lab 04/06/14 0615  AST 82*  ALT 64*  ALKPHOS 68  BILITOT 3.4*  PROT 5.9*  ALBUMIN 2.1*   No results found for this basename: LIPASE, AMYLASE,  in the last 168 hours No results found for this basename: AMMONIA,  in the last 168 hours CBC:  Recent Labs Lab 04/04/14 1400  WBC 18.3*  NEUTROABS 16.4*  HGB 13.8  HCT 40.3  MCV 86.9  PLT 176   Cardiac Enzymes: No results found for this basename: CKTOTAL, CKMB, CKMBINDEX, TROPONINI,  in the last 168 hours BNP (last 3 results)  Recent Labs  03/22/14 2108 03/26/14 1422 04/04/14 1413  PROBNP 3661.0* 3960.0* 2927.0*   CBG: No results found for this basename: GLUCAP,  in the last 168 hours  Recent Results (from the past 240 hour(s))  CULTURE, BLOOD (ROUTINE X 2)     Status: None   Collection Time    04/04/14  2:13 PM      Result Value Ref Range Status   Specimen Description BLOOD LEFT FOREARM   Final   Special Requests BOTTLES DRAWN AEROBIC ONLY  10CC   Final   Culture  Setup Time     Final   Value: 04/04/2014 18:42     Performed at [facility]   Culture     Final   Value:        BLOOD CULTURE RECEIVED NO GROWTH TO DATE CULTURE WILL BE HELD FOR 5 DAYS BEFORE ISSUING A FINAL NEGATIVE REPORT     Performed at [facility]   Report Status PENDING   Incomplete  CULTURE, BLOOD (ROUTINE X 2)     Status: None   Collection Time    04/04/14  2:55 PM      Result Value Ref Range Status   Specimen Description BLOOD LEFT ARM   Final   Special Requests BOTTLES DRAWN AEROBIC AND ANAEROBIC 3CC   Final   Culture  Setup Time     Final   Value: 04/04/2014 18:42     Performed at [facility]   Culture     Final   Value:        BLOOD CULTURE RECEIVED NO GROWTH TO DATE CULTURE WILL BE  HELD FOR 5 DAYS BEFORE ISSUING A FINAL NEGATIVE REPORT     Performed at [facility]   Report Status PENDING   Incomplete     Studies: Dg Chest 2 View  04/04/2014   CLINICAL DATA:  Fever.  Shortness of breath.  EXAM: CHEST  2 VIEW  COMPARISON:  DG CHEST 2 VIEW dated 03/26/2014; DG CHEST 2V dated 12/02/2013  FINDINGS: Mediastinum and hilar structures are normal. Cardiomegaly with normal pulmonary vascularity is present. Cardiomegaly is stable from 03/26/2014 but increased from 12/02/2013. Right lower lobe infiltrate noted most consistent with pneumonia. Asymmetric pulmonary edema is less likely. No pleural effusion or pneumothorax. No acute bony abnormality.  IMPRESSION: 1. Right lower lobe prominent infiltrate consistent with pneumonia. 2. Cardiomegaly, cardiac ECHO is suggested for further evaluation.   Electronically Signed   By: Maisie Fus  Register   On: 04/04/2014 16:28    Scheduled Meds: . aspirin EC  81 mg Oral Daily  . carvedilol  6.25 mg Oral BID WC  . ceFEPime (MAXIPIME) IV  1 g Intravenous 3 times per day  . isosorbide-hydrALAZINE  1 tablet Oral BID  . lisinopril  40 mg Oral Daily  . vancomycin  1,000 mg Intravenous Q12H   Continuous Infusions:    Marinda Elk  [facility] Pager [phone]. If 8PM-8AM, please contact night-coverage at [email or web address], password [facility] 04/06/2014, 8:01 AM  LOS: 2 days

## 2014-04-06 NOTE — Plan of Care (Signed)
Problem: Phase I Progression Outcomes Goal: EF % per last Echo/documented,Core Reminder form on chart Outcome: Completed/Met Date Met:  04/06/14 EF 30% per ECHO performed on 03/26/14.

## 2014-04-06 NOTE — Progress Notes (Addendum)
Pt with temp of 100.9. Blood cultures ordered at 01:45 per MD specific order. Tylenol held. Lab was called by RN to check availability of phlebotomists to come draw important blood cultures. RN was told that a phlebotomist would be there shortly to draw cultures with pt's AM labs. A phlebotomist came to floor at 04:15. RN rechecked pt's temp at this time, and pt's temp was currently 100.0. Blood cultures D/C'ed by RN at this time, as pt's temp now out of range. Tylenol given and pt's AM antibiotics started for this AM. Will pass on and continue to monitor.   Ability for blood cultures to be ordered as STAT has recently been removed from EPIC. I believe this is the basis of the incident.

## 2014-04-07 DIAGNOSIS — N179 Acute kidney failure, unspecified: Secondary | ICD-10-CM

## 2014-04-07 LAB — BASIC METABOLIC PANEL
BUN: 23 mg/dL (ref 6–23)
CALCIUM: 8.2 mg/dL — AB (ref 8.4–10.5)
CO2: 26 mEq/L (ref 19–32)
Chloride: 97 mEq/L (ref 96–112)
Creatinine, Ser: 1.08 mg/dL (ref 0.50–1.35)
GFR calc Af Amer: >90 mL/min (ref >90)
GFR, EST NON AFRICAN AMERICAN: 80 mL/min — AB (ref >90)
Glucose, Bld: 114 mg/dL — ABNORMAL HIGH (ref 70–99)
Potassium: 3.9 mEq/L (ref 3.7–5.3)
SODIUM: 135 meq/L — AB (ref 137–147)

## 2014-04-07 MED ORDER — LEVOFLOXACIN 750 MG PO TABS
750.0000 mg | ORAL_TABLET | Freq: Every day | ORAL | Status: DC
  Administered 2014-04-07 – 2014-04-08 (×2): 750 mg via ORAL
  Filled 2014-04-07 (×2): qty 1

## 2014-04-07 NOTE — Progress Notes (Signed)
[facility] PROGRESS NOTE Assessment/Plan: Hospital acquired PNA - On vanc and cefepime 4.29.2015. De-escalate to oral levaquin - HIV negative, urine legionellae pending. - afebrile, BC negative till date.  Hyponatremia: - Urine sodium < 20. Most likely pre-renal. - Hold diuretics. Cont oral hydration. - resume diuretics as an outpatient.  AKI: - Now resolved, most likely pre-renal. - Cont ACE-I.  Chronic systolic heart failure/ Non-ischemic cardiomyopathy- cath 03/29/14/ echo 4.20.2015: LVEF is 30% with diffuse hypokinesis: - Showed CAD non-obstructive. - Now Euvolemic, cont to hold diuretics, Bp low. - cont ACE-I, coreg and BiDil.  Essential hypertension - borderline low.    Code Status: full code  Family Communication: none present at the bedside  Disposition Plan: inpatient    Consultants:  none  Procedures:  CXR  Antibiotics:  vanc and cefepime 4.30.2015  HPI/Subjective: Relates he does feels better  Objective: Filed Vitals:   04/06/14 1500 04/06/14 1700 04/06/14 2137 04/07/14 0700  BP: 109/51  106/63   Pulse: 148  87 96  Temp: 98 F (36.7 C) 98.2 F (36.8 C) 98.4 F (36.9 C) 98.9 F (37.2 C)  TempSrc: Oral  Oral Oral  Resp: 24  18 18   Height:      Weight:    83.19 kg (183 lb 6.4 oz)  SpO2: 98%  100% 100%    Intake/Output Summary (Last 24 hours) at 04/07/14 0803 Last data filed at 04/07/14 0500  Gross per 24 hour  Intake    360 ml  Output   1400 ml  Net  -1040 ml   Filed Weights   04/05/14 0530 04/06/14 0537 04/07/14 0700  Weight: 82.872 kg (182 lb 11.2 oz) 83.553 kg (184 lb 3.2 oz) 83.19 kg (183 lb 6.4 oz)    Exam:  General: Alert, awake, oriented x3, in no acute distress.  HEENT: No bruits, no goiter. -JVD Heart: Regular rate and rhythm. Lungs: Good air movement, crackles on the left. Abdomen: Soft, nontender, nondistended, positive bowel sounds.     Data Reviewed: Basic Metabolic Panel:  Recent Labs Lab  04/04/14 1400 04/05/14 0550 04/06/14 0615 04/07/14 0505  NA 130* 130* 127* 135*  K 4.7 4.4 4.1 3.9  CL 94* 94* 92* 97  CO2 21 20 20 26   GLUCOSE 112* 110* 103* 114*  BUN 20 24* 26* 23  CREATININE 1.35 1.44* 1.32 1.08  CALCIUM 8.6 8.5 8.2* 8.2*   Liver Function Tests:  Recent Labs Lab 04/06/14 0615  AST 82*  ALT 64*  ALKPHOS 68  BILITOT 3.4*  PROT 5.9*  ALBUMIN 2.1*   No results found for this basename: LIPASE, AMYLASE,  in the last 168 hours No results found for this basename: AMMONIA,  in the last 168 hours CBC:  Recent Labs Lab 04/04/14 1400 04/06/14 0615  WBC 18.3* 11.1*  NEUTROABS 16.4* 9.2*  HGB 13.8 12.0*  HCT 40.3 36.4*  MCV 86.9 87.3  PLT 176 179   Cardiac Enzymes: No results found for this basename: CKTOTAL, CKMB, CKMBINDEX, TROPONINI,  in the last 168 hours BNP (last 3 results)  Recent Labs  03/22/14 2108 03/26/14 1422 04/04/14 1413  PROBNP 3661.0* 3960.0* 2927.0*   CBG: No results found for this basename: GLUCAP,  in the last 168 hours  Recent Results (from the past 240 hour(s))  CULTURE, BLOOD (ROUTINE X 2)     Status: None   Collection Time    04/04/14  2:13 PM      Result Value Ref Range Status  Specimen Description BLOOD LEFT FOREARM   Final   Special Requests BOTTLES DRAWN AEROBIC ONLY 10CC   Final   Culture  Setup Time     Final   Value: 04/04/2014 18:42     [facility]   Culture     Final   Value:        BLOOD CULTURE RECEIVED NO GROWTH TO DATE CULTURE WILL BE HELD FOR 5 DAYS BEFORE ISSUING A FINAL NEGATIVE REPORT     [facility]   Report Status PENDING   Incomplete  CULTURE, BLOOD (ROUTINE X 2)     Status: None   Collection Time    04/04/14  2:55 PM      Result Value Ref Range Status   Specimen Description BLOOD LEFT ARM   Final   Special Requests BOTTLES DRAWN AEROBIC AND ANAEROBIC 3CC   Final   Culture  Setup Time     Final   Value: 04/04/2014 18:42     [facility]   Culture     Final   Value:        BLOOD CULTURE RECEIVED NO GROWTH TO DATE CULTURE WILL BE HELD FOR 5 DAYS BEFORE ISSUING A FINAL NEGATIVE REPORT     [facility]   Report Status PENDING   Incomplete     Studies: No results found.  Scheduled Meds: . aspirin EC  81 mg Oral Daily  . atorvastatin  10 mg Oral q1800  . carvedilol  6.25 mg Oral BID WC  . ceFEPime (MAXIPIME) IV  1 g Intravenous 3 times per day  . isosorbide-hydrALAZINE  1 tablet Oral BID  . lisinopril  40 mg Oral Daily  . vancomycin  1,000 mg Intravenous Q12H   Continuous Infusions:    Mark ElkAbraham Feliz Horton  [facility] Pager [phone]. If 8PM-8AM, please contact night-coverage at [email or web address], password [email or web address] 04/07/2014, 8:03 AM  LOS: 3 days

## 2014-04-08 MED ORDER — LEVOFLOXACIN 750 MG PO TABS: 750.0000 mg | ORAL_TABLET | Freq: Every day | ORAL | Status: DC

## 2014-04-08 NOTE — Discharge Summary (Addendum)
Physician Discharge Summary  Mark Horton MBE:[id number] DOB: [birth year] DOA: 04/04/2014  PCP: No primary provider on file.  Admit date: 04/04/2014 Discharge date: 04/08/2014  Time spent: 35 minutes  Recommendations for Outpatient Follow-up:  1. Follow up with PCP in 1 week.  Discharge Diagnoses:  Principal Problem:   Hospital acquired PNA Active Problems:   Non-ischemic cardiomyopathy- cath 03/29/14   Essential hypertension   Acute on chronic systolic heart failure   HCAP (healthcare-associated pneumonia)   Hyponatremia   AKI (acute kidney injury)   Discharge Condition: stable  Diet recommendation: low sodium diet  Filed Weights   04/06/14 0537 04/07/14 0700 04/08/14 0516  Weight: 83.553 kg (184 lb 3.2 oz) 83.19 kg (183 lb 6.4 oz) 83.462 kg (184 lb)    History of present illness:  48 y/o CM, known h/o chronic ETOh use, newly diagnosed CHF-potenttially HTN mediated, Recent admission admitted by Cardiology 03/26/14 for Iv diuresis, [id number]-ECHO on that admission EF30%- ?Cardiac cath done =40% LAD ossification, serverely elevated LVEDP-thought to be NICM c preserved CO, was diuresed about 6.8 liter and d/c from hospital 03/31/14  He tells me that he was admitted in December 2000 1440 pneumonia at [facility] and was given a prescription for antibiotics but never filled the prescription and thinks he's been battling bronchitis pretty much on and off since he was last in the hospital. 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  He states that when he came to the hospital yesterday he was coughing and had blood-tinged mucus. This has since resolved since starting antibiotics   Hospital Course:  Hospital acquired PNA   - On vanc and cefepime 4.29.2015. De-escalate to oral levaquin  - HIV negative, urine legionellae pending.  - afebrile, BC negative till date.  - cont levaquin for 4 days.  Hyponatremia:  - Urine sodium < 20. Most likely pre-renal.  - Hold diuretics. Cont oral hydration.  - resume diuretics as an outpatient.   AKI:  - Now resolved, most likely pre-renal.  - Cont ACE-I.   Chronic systolic heart failure/ Non-ischemic cardiomyopathy- cath 03/29/14/ echo 4.20.2015: LVEF is 30% with diffuse hypokinesis:  - Showed CAD non-obstructive.  - Now Euvolemic, cont to hold diuretics, Bp low.  - cont ACE-I, coreg and BiDil.   Essential hypertension  - now control.  Elevated LFT's.: - he does admit to ETOH abuse. - no abdominal symptoms, fever and tolerating diet. - he will want this to be w/u as an outpatient.   Procedures:  CXR  Consultations:  none  Discharge Exam: Filed Vitals:   04/08/14 0516  BP: 127/86  Pulse: 81  Temp: 97.7 F (36.5 C)  Resp: 18    General: A&O x3 Cardiovascular: RRR Respiratory: good air movement CTA B/L  Discharge Instructions You were cared for by a hospitalist during your hospital stay. If you have any questions about your discharge medications or the care you received while you were in the hospital after you are discharged, you can call the unit and asked to speak with the hospitalist on call if the hospitalist that took care of you is not available. Once you are discharged, your primary care physician will handle any further medical  issues. Please note that NO REFILLS for any discharge medications will be authorized once you are discharged, as it is imperative that you return to your primary care physician (or establish a relationship with a primary care physician if you do not have one) for your aftercare needs so that they can reassess your need for medications and monitor your lab values.  Discharge Orders   Future Appointments Provider  Department Dept Phone   04/11/2014 10:00 AM Rosalio MacadamiaLori C Gerhardt, NP [facility] [address] Office [phone]   05/23/2014 10:45 AM Lewayne BuntingBrian S Crenshaw, MD [facility] [place] [phone]   Future Orders Complete By Expires   Diet - low sodium heart healthy  As directed    Increase activity slowly  As directed        Medication List    STOP taking these medications       azithromycin 250 MG tablet  Commonly known as:  ZITHROMAX Z-PAK      TAKE these medications       aspirin 81 MG EC tablet  Take 1 tablet (81 mg total) by mouth daily.     atorvastatin 10 MG tablet  Commonly known as:  LIPITOR  Take 1 tablet (10 mg total) by mouth daily.     carvedilol 6.25 MG tablet  Commonly known as:  COREG  Take 1 tablet (6.25 mg total) by mouth 2 (two) times daily with a meal.     furosemide 40 MG tablet  Commonly known as:  LASIX  Take 2 tablets (80 mg total) by mouth 2 (two) times daily.     isosorbide-hydrALAZINE 20-37.5 MG per tablet  Commonly known as:  BIDIL  Take 1 tablet by mouth 2 (two) times daily.     levofloxacin 750 MG tablet  Commonly known as:  LEVAQUIN  Take 1 tablet (750 mg total) by mouth daily.     lisinopril 40 MG tablet  Commonly known as:  PRINIVIL,ZESTRIL  Take 1 tablet (40 mg total) by mouth daily.       No Known Allergies    The results of significant diagnostics from this hospitalization (including imaging, microbiology, ancillary and laboratory) are listed below for reference.    Significant Diagnostic Studies: Dg Chest 2 View  04/04/2014   CLINICAL DATA:  Fever.  Shortness of breath.  EXAM: CHEST  2 VIEW  COMPARISON:  DG CHEST 2 VIEW dated 03/26/2014; DG CHEST 2V dated 12/02/2013  FINDINGS: Mediastinum and hilar structures are normal. Cardiomegaly with normal pulmonary vascularity is present. Cardiomegaly is stable from 03/26/2014 but increased from 12/02/2013. Right lower lobe infiltrate noted most consistent with pneumonia. Asymmetric pulmonary  edema is less likely. No pleural effusion or pneumothorax. No acute bony abnormality.  IMPRESSION: 1. Right lower lobe prominent infiltrate consistent with pneumonia. 2. Cardiomegaly, cardiac ECHO is suggested for further evaluation.   Electronically Signed   By: Maisie Fushomas  Register   On: 04/04/2014 16:28   Dg Chest 2 View  03/26/2014   CLINICAL DATA:  New CHF.  History of hypertension.  Smoker.  EXAM: CHEST  2 VIEW  COMPARISON:  03/22/2014  FINDINGS: Mild cardiomegaly. No confluent opacities in the lungs. No effusions or edema. No acute bony abnormality.  IMPRESSION: Cardiomegaly.  No active disease.   Electronically Signed   By: Charlett NoseKevin  Dover M.D.   On: 03/26/2014 17:26   Dg Chest 2 View  03/22/2014   CLINICAL DATA:  Chest pain, shortness of breath  EXAM: CHEST  2 VIEW  COMPARISON:  DG CHEST 2V dated 12/02/2013  FINDINGS: Bilateral diffuse interstitial thickening and peribronchial cuffing most concerning for bronchitis. There is no focal parenchymal opacity, pleural effusion, or pneumothorax. The heart and mediastinal contours are unremarkable.  The osseous structures are unremarkable.  IMPRESSION: Bilateral diffuse interstitial thickening and peribronchial cuffing most concerning for bronchitis.   Electronically Signed   By: Elige Ko   On: 03/22/2014 21:18    Microbiology: Recent Results (from the past 240 hour(s))  CULTURE, BLOOD (ROUTINE X 2)     Status: None   Collection Time    04/04/14  2:13 PM      Result Value Ref Range Status   Specimen Description BLOOD LEFT FOREARM   Final   Special Requests BOTTLES DRAWN AEROBIC ONLY 10CC   Final   Culture  Setup Time     Final   Value: 04/04/2014 18:42     Performed at [facility]   Culture     Final   Value:        BLOOD CULTURE RECEIVED NO GROWTH TO DATE CULTURE WILL BE HELD FOR 5 DAYS BEFORE ISSUING A FINAL NEGATIVE REPORT     Performed at [facility]   Report Status PENDING   Incomplete  CULTURE, BLOOD (ROUTINE X 2)      Status: None   Collection Time    04/04/14  2:55 PM      Result Value Ref Range Status   Specimen Description BLOOD LEFT ARM   Final   Special Requests BOTTLES DRAWN AEROBIC AND ANAEROBIC 3CC   Final   Culture  Setup Time     Final   Value: 04/04/2014 18:42     Performed at [facility]   Culture     Final   Value:        BLOOD CULTURE RECEIVED NO GROWTH TO DATE CULTURE WILL BE HELD FOR 5 DAYS BEFORE ISSUING A FINAL NEGATIVE REPORT     Performed at [facility]   Report Status PENDING   Incomplete     Labs: Basic Metabolic Panel:  Recent Labs Lab 04/04/14 1400 04/05/14 0550 04/06/14 0615 04/07/14 0505  NA 130* 130* 127* 135*  K 4.7 4.4 4.1 3.9  CL 94* 94* 92* 97  CO2 21 20 20 26   GLUCOSE 112* 110* 103* 114*  BUN 20 24* 26* 23  CREATININE 1.35 1.44* 1.32 1.08  CALCIUM 8.6 8.5 8.2* 8.2*   Liver Function Tests:  Recent Labs Lab 04/06/14 0615  AST 82*  ALT 64*  ALKPHOS 68  BILITOT 3.4*  PROT 5.9*  ALBUMIN 2.1*   No results found for this basename: LIPASE, AMYLASE,  in the last 168 hours No results found for this basename: AMMONIA,  in the last 168 hours CBC:  Recent Labs Lab 04/04/14 1400 04/06/14 0615  WBC 18.3* 11.1*  NEUTROABS 16.4* 9.2*  HGB 13.8 12.0*  HCT 40.3 36.4*  MCV 86.9 87.3  PLT 176 179   Cardiac Enzymes: No results found for this basename: CKTOTAL, CKMB, CKMBINDEX, TROPONINI,  in the last 168 hours BNP: BNP (last 3 results)  Recent Labs  03/22/14 2108 03/26/14 1422 04/04/14 1413  PROBNP 3661.0* 3960.0* 2927.0*   CBG: No results found for this basename: GLUCAP,  in the last 168 hours   Signed:  Marinda Elk  [facility] 04/08/2014, 8:06 AM

## 2014-04-10 LAB — CULTURE, BLOOD (ROUTINE X 2)
CULTURE: NO GROWTH
Culture: NO GROWTH

## 2014-04-11 ENCOUNTER — Ambulatory Visit: Payer: Non-veteran care | Admitting: Nurse Practitioner

## 2014-05-23 ENCOUNTER — Ambulatory Visit: Payer: Non-veteran care | Admitting: Cardiology

## 2014-06-20 ENCOUNTER — Encounter: Payer: Non-veteran care | Admitting: Cardiology

## 2014-06-20 NOTE — Progress Notes (Signed)
HPI: FU CHF. Echocardiogram in April 2015 showed an ejection fraction of 30%. Cardiac catheterization in April of 2015 showed minimal nonobstructive coronary disease. Pulmonary capillary wedge pressure 33. CHF felt most likely secondary to hypertension. Patient placed on medications and treated with IV diuretics. There was significant improvement. Note liver functions mildly increased. TSH normal. Readmitted with hospital acquired pneumonia. Treated with antibiotics with improvement. Since he was last seen,    Current Outpatient Prescriptions  Medication Sig Dispense Refill  . aspirin EC 81 MG EC tablet Take 1 tablet (81 mg total) by mouth daily.      Marland Kitchen atorvastatin (LIPITOR) 10 MG tablet Take 1 tablet (10 mg total) by mouth daily.  30 tablet  6  . carvedilol (COREG) 6.25 MG tablet Take 1 tablet (6.25 mg total) by mouth 2 (two) times daily with a meal.  60 tablet  6  . furosemide (LASIX) 40 MG tablet Take 2 tablets (80 mg total) by mouth 2 (two) times daily.  60 tablet  12  . isosorbide-hydrALAZINE (BIDIL) 20-37.5 MG per tablet Take 1 tablet by mouth 2 (two) times daily.  60 tablet  6  . levofloxacin (LEVAQUIN) 750 MG tablet Take 1 tablet (750 mg total) by mouth daily.  4 tablet  0  . lisinopril (PRINIVIL,ZESTRIL) 40 MG tablet Take 1 tablet (40 mg total) by mouth daily.  30 tablet  0   No current facility-administered medications for this visit.     Past Medical History  Diagnosis Date  . Hypertension   . Complication of anesthesia     It does not last long enough " I wake up I feel everything "  . Chronic systolic CHF (congestive heart failure)     a. 03/2013 Echo: EF 30%, diff HK, worse in inf/infsept/apical walls, PASP  . NICM (nonischemic cardiomyopathy)     a. 03/2013 Echo: EF 30%.  Marland Kitchen CAD (coronary artery disease) Non-obstructive    a. 03/2014 Cath: Elevated R heart pressures, LM nl, LAD 40p, D1/2 nl, LCX nl, RCA nl.  . H/O hiatal hernia   . GERD (gastroesophageal  reflux disease)     Past Surgical History  Procedure Laterality Date  . Cholecystectomy    . Left knee surgery    . Coronary angiogram  03/29/14    NL cors    History   Social History  . Marital Status: Legally Separated    Spouse Name: N/A    Number of Children: 1  . Years of Education: N/A   Occupational History  .      Holiday representative   Social History Main Topics  . Smoking status: Former Smoker -- 0.50 packs/day for 35 years    Types: Cigarettes  . Smokeless tobacco: Never Used     Comment: Pt has not smoked since the monday before last.  . Alcohol Use: No  . Drug Use: No  . Sexual Activity: Not on file   Other Topics Concern  . Not on file   Social History Narrative  . No narrative on file    ROS: no fevers or chills, productive cough, hemoptysis, dysphasia, odynophagia, melena, hematochezia, dysuria, hematuria, rash, seizure activity, orthopnea, PND, pedal edema, claudication. Remaining systems are negative.  Physical Exam: Well-developed well-nourished in no acute distress.  Skin is warm and dry.  HEENT is normal.  Neck is supple.  Chest is clear to auscultation with normal expansion.  Cardiovascular exam is regular rate and rhythm.  Abdominal exam  nontender or distended. No masses palpated. Extremities show no edema. neuro grossly intact  ECG     This encounter was created in error - please disregard.

## 2014-06-22 ENCOUNTER — Encounter: Payer: Self-pay | Admitting: Cardiology

## 2014-07-01 ENCOUNTER — Emergency Department (HOSPITAL_BASED_OUTPATIENT_CLINIC_OR_DEPARTMENT_OTHER)
Admission: EM | Admit: 2014-07-01 | Discharge: 2014-07-01 | Disposition: A | Discharge status: Home / Self Care | Payer: Non-veteran care | Attending: Emergency Medicine | Admitting: Emergency Medicine

## 2014-07-01 ENCOUNTER — Emergency Department (HOSPITAL_BASED_OUTPATIENT_CLINIC_OR_DEPARTMENT_OTHER): Payer: Non-veteran care

## 2014-07-01 ENCOUNTER — Encounter (HOSPITAL_BASED_OUTPATIENT_CLINIC_OR_DEPARTMENT_OTHER): Payer: Self-pay | Admitting: Emergency Medicine

## 2014-07-01 DIAGNOSIS — F17200 Nicotine dependence, unspecified, uncomplicated: Secondary | ICD-10-CM | POA: Insufficient documentation

## 2014-07-01 DIAGNOSIS — I2510 Atherosclerotic heart disease of native coronary artery without angina pectoris: Secondary | ICD-10-CM | POA: Insufficient documentation

## 2014-07-01 DIAGNOSIS — J40 Bronchitis, not specified as acute or chronic: Secondary | ICD-10-CM

## 2014-07-01 DIAGNOSIS — M549 Dorsalgia, unspecified: Secondary | ICD-10-CM | POA: Insufficient documentation

## 2014-07-01 DIAGNOSIS — R42 Dizziness and giddiness: Secondary | ICD-10-CM | POA: Insufficient documentation

## 2014-07-01 DIAGNOSIS — Z792 Long term (current) use of antibiotics: Secondary | ICD-10-CM | POA: Insufficient documentation

## 2014-07-01 DIAGNOSIS — Z79899 Other long term (current) drug therapy: Secondary | ICD-10-CM | POA: Insufficient documentation

## 2014-07-01 DIAGNOSIS — J209 Acute bronchitis, unspecified: Secondary | ICD-10-CM | POA: Insufficient documentation

## 2014-07-01 DIAGNOSIS — H539 Unspecified visual disturbance: Secondary | ICD-10-CM | POA: Insufficient documentation

## 2014-07-01 DIAGNOSIS — Z7982 Long term (current) use of aspirin: Secondary | ICD-10-CM | POA: Insufficient documentation

## 2014-07-01 DIAGNOSIS — Z9889 Other specified postprocedural states: Secondary | ICD-10-CM | POA: Insufficient documentation

## 2014-07-01 DIAGNOSIS — R079 Chest pain, unspecified: Secondary | ICD-10-CM | POA: Insufficient documentation

## 2014-07-01 DIAGNOSIS — Z8719 Personal history of other diseases of the digestive system: Secondary | ICD-10-CM | POA: Insufficient documentation

## 2014-07-01 DIAGNOSIS — I10 Essential (primary) hypertension: Secondary | ICD-10-CM | POA: Insufficient documentation

## 2014-07-01 DIAGNOSIS — I5022 Chronic systolic (congestive) heart failure: Secondary | ICD-10-CM | POA: Insufficient documentation

## 2014-07-01 LAB — COMPREHENSIVE METABOLIC PANEL
ALBUMIN: 3.6 g/dL (ref 3.5–5.2)
ALT: 17 U/L (ref 0–53)
AST: 19 U/L (ref 0–37)
Alkaline Phosphatase: 79 U/L (ref 39–117)
Anion gap: 11 (ref 5–15)
BUN: 13 mg/dL (ref 6–23)
CO2: 27 mEq/L (ref 19–32)
Calcium: 9.3 mg/dL (ref 8.4–10.5)
Chloride: 104 mEq/L (ref 96–112)
Creatinine, Ser: 1.1 mg/dL (ref 0.50–1.35)
GFR calc Af Amer: >90 mL/min (ref >90)
GFR calc non Af Amer: 78 mL/min — ABNORMAL LOW (ref >90)
Glucose, Bld: 112 mg/dL — ABNORMAL HIGH (ref 70–99)
POTASSIUM: 4.1 meq/L (ref 3.7–5.3)
SODIUM: 142 meq/L (ref 137–147)
TOTAL PROTEIN: 7.2 g/dL (ref 6.0–8.3)
Total Bilirubin: 0.6 mg/dL (ref 0.3–1.2)

## 2014-07-01 LAB — CBC WITH DIFFERENTIAL/PLATELET
Basophils Absolute: 0 10*3/uL (ref 0.0–0.1)
Basophils Relative: 0 % (ref 0–1)
EOS ABS: 0.3 10*3/uL (ref 0.0–0.7)
Eosinophils Relative: 3 % (ref 0–5)
HCT: 44.4 % (ref 39.0–52.0)
Hemoglobin: 15.1 g/dL (ref 13.0–17.0)
Lymphocytes Relative: 22 % (ref 12–46)
Lymphs Abs: 2 10*3/uL (ref 0.7–4.0)
MCH: 30 pg (ref 26.0–34.0)
MCHC: 34 g/dL (ref 30.0–36.0)
MCV: 88.3 fL (ref 78.0–100.0)
Monocytes Absolute: 0.9 10*3/uL (ref 0.1–1.0)
Monocytes Relative: 10 % (ref 3–12)
NEUTROS PCT: 65 % (ref 43–77)
Neutro Abs: 5.9 10*3/uL (ref 1.7–7.7)
PLATELETS: 206 10*3/uL (ref 150–400)
RBC: 5.03 MIL/uL (ref 4.22–5.81)
RDW: 14.4 % (ref 11.5–15.5)
WBC: 9.2 10*3/uL (ref 4.0–10.5)

## 2014-07-01 LAB — TROPONIN I
Troponin I: <0.3 ng/mL (ref <0.30)
Troponin I: <0.3 ng/mL (ref <0.30)

## 2014-07-01 LAB — PRO B NATRIURETIC PEPTIDE: Pro B Natriuretic peptide (BNP): 2393 pg/mL — ABNORMAL HIGH (ref 0–125)

## 2014-07-01 MED ORDER — DOXYCYCLINE HYCLATE 100 MG PO CAPS: 100.0000 mg | ORAL_CAPSULE | Freq: Two times a day (BID) | ORAL | Status: DC

## 2014-07-01 NOTE — ED Notes (Addendum)
I met patient at front door before registration began, patient complaining of chest pain with history of CHF, and back pain. I rushed to triage and got ecg, then to room and took vitals.

## 2014-07-01 NOTE — ED Notes (Signed)
Patient placed on wall monitor at time vitals were taken.

## 2014-07-01 NOTE — ED Notes (Signed)
Complaints of CP onset 2 days ago accompany by SOB, dizziness and back pain. Pain is sharpe, intermittent, lasted 2-3 minutes each time. Hx of CHF. No edema noted. Lungs are clear but diminished. VSS stable. T-wave inversion noted on the monitor.

## 2014-07-01 NOTE — ED Provider Notes (Signed)
CSN: [id number]     Arrival date & time 07/01/14  4098 History  This chart was scribed for Mark Bucco, MD by Nicholos Johns, ED scribe. This patient was seen in room MH06/MH06 and the patient's care was started at 7:48 PM.    Chief Complaint  Patient presents with  . Chest Pain  . Shortness of Breath   The history is provided by the patient. No language interpreter was used.   HPI Comments: Mark Horton is a 48 y.o. male w/ hx of CHF, NICM, HTN, and CAD presents to the Emergency Department complaining of intermittent sharp right sided chest pain; onset 2 days ago. Episodes lasting 4-5 seconds at a time. Associated SOB worse w/ exertion, lightheadedness, weakness, dizziness, and back pain. Also reports some splotchy vision; says it looks at if he is seeing through a kaleidescope. Some mild diarrhea at first; no recent episodes to report. States pain is similar to when he had pneumonia. No recent medication changes. Denies leg swelling or vomiting.   Cardiologist:Dr. Jens Som   Past Medical History  Diagnosis Date  . Hypertension   . Complication of anesthesia     It does not last long enough " I wake up I feel everything "  . Chronic systolic CHF (congestive heart failure)     a. 03/2013 Echo: EF 30%, diff HK, worse in inf/infsept/apical walls, PASP  . NICM (nonischemic cardiomyopathy)     a. 03/2013 Echo: EF 30%.  Marland Kitchen CAD (coronary artery disease) Non-obstructive    a. 03/2014 Cath: Elevated R heart pressures, LM nl, LAD 40p, D1/2 nl, LCX nl, RCA nl.  . H/O hiatal hernia   . GERD (gastroesophageal reflux disease)    Past Surgical History  Procedure Laterality Date  . Cholecystectomy    . Left knee surgery    . Coronary angiogram  03/29/14    NL cors   Family History  Problem Relation Age of Onset  . Heart disease Father     Stent and CHF   History  Substance Use Topics  . Smoking status: Current Some Day Smoker -- 0.50 packs/day for 35 years    Types: Cigarettes  .  Smokeless tobacco: Never Used     Comment: Pt has not smoked since the monday before last.  . Alcohol Use: No    Review of Systems  Constitutional: Negative for fever, chills, diaphoresis and fatigue.  HENT: Negative for congestion, rhinorrhea and sneezing.   Eyes: Positive for visual disturbance.  Respiratory: Positive for shortness of breath. Negative for cough and chest tightness.   Cardiovascular: Positive for chest pain. Negative for leg swelling.  Gastrointestinal: Negative for nausea, vomiting, abdominal pain, diarrhea and blood in stool.  Genitourinary: Negative for frequency, hematuria, flank pain and difficulty urinating.  Musculoskeletal: Positive for back pain. Negative for arthralgias.  Skin: Negative for rash.  Neurological: Positive for dizziness, weakness and light-headedness. Negative for speech difficulty, numbness and headaches.  All other systems reviewed and are negative.  Allergies  Review of patient's allergies indicates no known allergies.  Home Medications   Prior to Admission medications   Medication Sig Start Date End Date Taking? Authorizing Provider  hydrALAZINE (APRESOLINE) 25 MG tablet Take 25 mg by mouth 2 times daily at 12 noon and 4 pm.   Yes Historical Provider, MD  aspirin EC 81 MG EC tablet Take 1 tablet (81 mg total) by mouth daily. 03/31/14   Ok Anis, NP  atorvastatin (LIPITOR) 10 MG tablet Take  20 mg by mouth daily. 03/31/14   Ok Anis, NP  carvedilol (COREG) 6.25 MG tablet Take 12.5 mg by mouth 2 (two) times daily with a meal. 03/31/14   Ok Anis, NP  doxycycline (VIBRAMYCIN) 100 MG capsule Take 1 capsule (100 mg total) by mouth 2 (two) times daily. One po bid x 7 days 07/01/14   Mark Bucco, MD  furosemide (LASIX) 40 MG tablet Take 2 tablets (80 mg total) by mouth 2 (two) times daily. 04/04/14   Lewayne Bunting, MD  isosorbide-hydrALAZINE (BIDIL) 20-37.5 MG per tablet Take 1 tablet by mouth 2 (two) times daily.  03/31/14   Ok Anis, NP  levofloxacin (LEVAQUIN) 750 MG tablet Take 1 tablet (750 mg total) by mouth daily. 04/08/14   Marinda Elk, MD  lisinopril (PRINIVIL,ZESTRIL) 40 MG tablet Take 20 mg by mouth daily. 03/31/14   Ok Anis, NP   Triage vitals: BP 158/110  Pulse 88  Temp(Src) 98.2 F (36.8 C) (Oral)  Resp 22  Ht 5\' 6"  (1.676 m)  Wt 186 lb (84.369 kg)  BMI 30.04 kg/m2  SpO2 100%  Physical Exam  Nursing note and vitals reviewed. Constitutional: He is oriented to person, place, and time. He appears well-developed and well-nourished.  HENT:  Head: Normocephalic and atraumatic.  Eyes: Pupils are equal, round, and reactive to light.  Neck: Normal range of motion. Neck supple.  Cardiovascular: Normal rate, regular rhythm and normal heart sounds.   Pulmonary/Chest: Effort normal and breath sounds normal. No respiratory distress. He has no wheezes. He has no rales. He exhibits no tenderness.  Few crackles in the bases bilaterally; more on the right. No increased work of breathing.   Abdominal: Soft. Bowel sounds are normal. There is no tenderness. There is no rebound and no guarding.  Musculoskeletal: Normal range of motion. He exhibits no edema.  No pital edema or calf tenderness.   Lymphadenopathy:    He has no cervical adenopathy.  Neurological: He is alert and oriented to person, place, and time.  Skin: Skin is warm and dry. No rash noted.  Psychiatric: He has a normal mood and affect.    ED Course  Procedures (including critical care time) DIAGNOSTIC STUDIES: Oxygen Saturation is 100% on room air, normal by my interpretation.    COORDINATION OF CARE: At 7:51 PM: Discussed treatment plan with patient which includes CXR and lab work. Patient agrees.    Labs Review Results for orders placed during the hospital encounter of 07/01/14  CBC WITH DIFFERENTIAL      Result Value Ref Range   WBC 9.2  4.0 - 10.5 K/uL   RBC 5.03  4.22 - 5.81 MIL/uL    Hemoglobin 15.1  13.0 - 17.0 g/dL   HCT 44.3  15.4 - 00.8 %   MCV 88.3  78.0 - 100.0 fL   MCH 30.0  26.0 - 34.0 pg   MCHC 34.0  30.0 - 36.0 g/dL   RDW 67.6  19.5 - 09.3 %   Platelets 206  150 - 400 K/uL   Neutrophils Relative % 65  43 - 77 %   Neutro Abs 5.9  1.7 - 7.7 K/uL   Lymphocytes Relative 22  12 - 46 %   Lymphs Abs 2.0  0.7 - 4.0 K/uL   Monocytes Relative 10  3 - 12 %   Monocytes Absolute 0.9  0.1 - 1.0 K/uL   Eosinophils Relative 3  0 - 5 %  Eosinophils Absolute 0.3  0.0 - 0.7 K/uL   Basophils Relative 0  0 - 1 %   Basophils Absolute 0.0  0.0 - 0.1 K/uL  COMPREHENSIVE METABOLIC PANEL      Result Value Ref Range   Sodium 142  137 - 147 mEq/L   Potassium 4.1  3.7 - 5.3 mEq/L   Chloride 104  96 - 112 mEq/L   CO2 27  19 - 32 mEq/L   Glucose, Bld 112 (*) 70 - 99 mg/dL   BUN 13  6 - 23 mg/dL   Creatinine, Ser 1.611.10  0.50 - 1.35 mg/dL   Calcium 9.3  8.4 - 09.610.5 mg/dL   Total Protein 7.2  6.0 - 8.3 g/dL   Albumin 3.6  3.5 - 5.2 g/dL   AST 19  0 - 37 U/L   ALT 17  0 - 53 U/L   Alkaline Phosphatase 79  39 - 117 U/L   Total Bilirubin 0.6  0.3 - 1.2 mg/dL   GFR calc non Af Amer 78 (*) >90 mL/min   GFR calc Af Amer >90  >90 mL/min   Anion gap 11  5 - 15  TROPONIN I      Result Value Ref Range   Troponin I <0.30  <0.30 ng/mL  PRO B NATRIURETIC PEPTIDE      Result Value Ref Range   Pro B Natriuretic peptide (BNP) 2393.0 (*) 0 - 125 pg/mL  TROPONIN I      Result Value Ref Range   Troponin I <0.30  <0.30 ng/mL   Dg Chest 2 View  07/01/2014   CLINICAL DATA:  Chest pain and shortness of Breath.  EXAM: CHEST  2 VIEW  COMPARISON:  04/04/2014.  FINDINGS: The cardiac silhouette, mediastinal and hilar contours are upper limits normal. The lungs are clear. No pleural effusion. The bony thorax is intact.  IMPRESSION: No acute cardiopulmonary findings. Complete resolution of the pneumonia seen on the prior study.   Electronically Signed   By: Loralie ChampagneMark  Gallerani M.D.   On: 07/01/2014 19:56       Imaging Review Dg Chest 2 View  07/01/2014   CLINICAL DATA:  Chest pain and shortness of Breath.  EXAM: CHEST  2 VIEW  COMPARISON:  04/04/2014.  FINDINGS: The cardiac silhouette, mediastinal and hilar contours are upper limits normal. The lungs are clear. No pleural effusion. The bony thorax is intact.  IMPRESSION: No acute cardiopulmonary findings. Complete resolution of the pneumonia seen on the prior study.   Electronically Signed   By: Loralie ChampagneMark  Gallerani M.D.   On: 07/01/2014 19:56     EKG Interpretation   Date/Time:  Sunday July 01 2014 23:23:45 EDT Ventricular Rate:  88 PR Interval:  150 QRS Duration: 96 QT Interval:  404 QTC Calculation: 488 R Axis:   3 Text Interpretation:  Normal sinus rhythm Left atrial enlargement Left  ventricular hypertrophy ST \\T \ T wave abnormality, consider inferolateral  ischemia Prolonged QT Abnormal ECG since last tracing no significant  change Confirmed by Kimesha Claxton  MD, Cory Rama ([id number]) on 07/01/2014 11:31:08 PM      MDM   Final diagnoses:  Bronchitis  Chest pain, unspecified chest pain type   Pt presents with CP, non-exertional, some exertional SOB, cough, dizziness, back pain.  No ischemic changes of serial EKGs, delta troponins negative.  No signs of fluid overload, CXR clear, BNP similar to prior values, no increased WOB or worsening orthopnea.  Had recent cath done in April 2015  showing only mild disease.  No suggestions of PE, no hypoxia.  No pneumonia on CXR.  Spoke with cardiology fellow on call who is comfortable with pt being discharged, close f/u with Dr. Jens Som.  Does have cough, symptoms that feel like his past pneumonia, will start doxycylcline.  Advised to return if symptoms worsen.  I personally performed the services described in this documentation, which was scribed in my presence. The recorded information has been reviewed and is accurate.     Mark Bucco, MD 07/01/14 [phone]

## 2014-07-01 NOTE — Discharge Instructions (Signed)
Chest Pain (Nonspecific) °It is often hard to give a specific diagnosis for the cause of chest pain. There is always a chance that your pain could be related to something serious, such as a heart attack or a blood clot in the lungs. You need to follow up with your health care provider for further evaluation. °CAUSES  °· Heartburn. °· Pneumonia or bronchitis. °· Anxiety or stress. °· Inflammation around your heart (pericarditis) or lung (pleuritis or pleurisy). °· A blood clot in the lung. °· A collapsed lung (pneumothorax). It can develop suddenly on its own (spontaneous pneumothorax) or from trauma to the chest. °· Shingles infection (herpes zoster virus). °The chest wall is composed of bones, muscles, and cartilage. Any of these can be the source of the pain. °· The bones can be bruised by injury. °· The muscles or cartilage can be strained by coughing or overwork. °· The cartilage can be affected by inflammation and become sore (costochondritis). °DIAGNOSIS  °Lab tests or other studies may be needed to find the cause of your pain. Your health care provider may have you take a test called an ambulatory electrocardiogram (ECG). An ECG records your heartbeat patterns over a 24-hour period. You may also have other tests, such as: °· Transthoracic echocardiogram (TTE). During echocardiography, sound waves are used to evaluate how blood flows through your heart. °· Transesophageal echocardiogram (TEE). °· Cardiac monitoring. This allows your health care provider to monitor your heart rate and rhythm in real time. °· Holter monitor. This is a portable device that records your heartbeat and can help diagnose heart arrhythmias. It allows your health care provider to track your heart activity for several days, if needed. °· Stress tests by exercise or by giving medicine that makes the heart beat faster. °TREATMENT  °· Treatment depends on what may be causing your chest pain. Treatment may include: °· Acid blockers for  heartburn. °· Anti-inflammatory medicine. °· Pain medicine for inflammatory conditions. °· Antibiotics if an infection is present. °· You may be advised to change lifestyle habits. This includes stopping smoking and avoiding alcohol, caffeine, and chocolate. °· You may be advised to keep your head raised (elevated) when sleeping. This reduces the chance of acid going backward from your stomach into your esophagus. °Most of the time, nonspecific chest pain will improve within 2-3 days with rest and mild pain medicine.  °HOME CARE INSTRUCTIONS  °· If antibiotics were prescribed, take them as directed. Finish them even if you start to feel better. °· For the next few days, avoid physical activities that bring on chest pain. Continue physical activities as directed. °· Do not use any tobacco products, including cigarettes, chewing tobacco, or electronic cigarettes. °· Avoid drinking alcohol. °· Only take medicine as directed by your health care provider. °· Follow your health care provider's suggestions for further testing if your chest pain does not go away. °· Keep any follow-up appointments you made. If you do not go to an appointment, you could develop lasting (chronic) problems with pain. If there is any problem keeping an appointment, call to reschedule. °SEEK MEDICAL CARE IF:  °· Your chest pain does not go away, even after treatment. °· You have a rash with blisters on your chest. °· You have a fever. °SEEK IMMEDIATE MEDICAL CARE IF:  °· You have increased chest pain or pain that spreads to your arm, neck, jaw, back, or abdomen. °· You have shortness of breath. °· You have an increasing cough, or you cough   up blood. °· You have severe back or abdominal pain. °· You feel nauseous or vomit. °· You have severe weakness. °· You faint. °· You have chills. °This is an emergency. Do not wait to see if the pain will go away. Get medical help at once. Call your local emergency services (911 in [place] Do not drive  yourself to the hospital. °MAKE SURE YOU:  °· Understand these instructions. °· Will watch your condition. °· Will get help right away if you are not doing well or get worse. °Document Released: 09/02/2005 Document Revised: 11/28/2013 Document Reviewed: 06/28/2008 °ExitCare® Patient Information ©2015 [employer]. This information is not intended to replace advice given to you by your health care provider. Make sure you discuss any questions you have with your health care provider. ° °Upper Respiratory Infection, Adult °An upper respiratory infection (URI) is also sometimes known as the common cold. The upper respiratory tract includes the nose, sinuses, throat, trachea, and bronchi. Bronchi are the airways leading to the lungs. Most people improve within 1 week, but symptoms can last up to 2 weeks. A residual cough may last even longer.  °CAUSES °Many different viruses can infect the tissues lining the upper respiratory tract. The tissues become irritated and inflamed and often become very moist. Mucus production is also common. A cold is contagious. You can easily spread the virus to others by oral contact. This includes kissing, sharing a glass, coughing, or sneezing. Touching your mouth or nose and then touching a surface, which is then touched by another person, can also spread the virus. °SYMPTOMS  °Symptoms typically develop 1 to 3 days after you come in contact with a cold virus. Symptoms vary from person to person. They may include: °· Runny nose. °· Sneezing. °· Nasal congestion. °· Sinus irritation. °· Sore throat. °· Loss of voice (laryngitis). °· Cough. °· Fatigue. °· Muscle aches. °· Loss of appetite. °· Headache. °· Low-grade fever. °DIAGNOSIS  °You might diagnose your own cold based on familiar symptoms, since most people get a cold 2 to 3 times a year. Your caregiver can confirm this based on your exam. Most importantly, your caregiver can check that your symptoms are not due to another disease  such as strep throat, sinusitis, pneumonia, asthma, or epiglottitis. Blood tests, throat tests, and X-rays are not necessary to diagnose a common cold, but they may sometimes be helpful in excluding other more serious diseases. Your caregiver will decide if any further tests are required. °RISKS AND COMPLICATIONS  °You may be at risk for a more severe case of the common cold if you smoke cigarettes, have chronic heart disease (such as heart failure) or lung disease (such as asthma), or if you have a weakened immune system. The very young and very old are also at risk for more serious infections. Bacterial sinusitis, middle ear infections, and bacterial pneumonia can complicate the common cold. The common cold can worsen asthma and chronic obstructive pulmonary disease (COPD). Sometimes, these complications can require emergency medical care and may be life-threatening. °PREVENTION  °The best way to protect against getting a cold is to practice good hygiene. Avoid oral or hand contact with people with cold symptoms. Wash your hands often if contact occurs. There is no clear evidence that vitamin C, vitamin E, echinacea, or exercise reduces the chance of developing a cold. However, it is always recommended to get plenty of rest and practice good nutrition. °TREATMENT  °Treatment is directed at relieving symptoms. There is no   cure. Antibiotics are not effective, because the infection is caused by a virus, not by bacteria. Treatment may include: °· Increased fluid intake. Sports drinks offer valuable electrolytes, sugars, and fluids. °· Breathing heated mist or steam (vaporizer or shower). °· Eating chicken soup or other clear broths, and maintaining good nutrition. °· Getting plenty of rest. °· Using gargles or lozenges for comfort. °· Controlling fevers with ibuprofen or acetaminophen as directed by your caregiver. °· Increasing usage of your inhaler if you have asthma. °Zinc gel and zinc lozenges, taken in the first  24 hours of the common cold, can shorten the duration and lessen the severity of symptoms. Pain medicines may help with fever, muscle aches, and throat pain. A variety of non-prescription medicines are available to treat congestion and runny nose. Your caregiver can make recommendations and may suggest nasal or lung inhalers for other symptoms.  °HOME CARE INSTRUCTIONS  °· Only take over-the-counter or prescription medicines for pain, discomfort, or fever as directed by your caregiver. °· Use a warm mist humidifier or inhale steam from a shower to increase air moisture. This may keep secretions moist and make it easier to breathe. °· Drink enough water and fluids to keep your urine clear or pale yellow. °· Rest as needed. °· Return to work when your temperature has returned to normal or as your caregiver advises. You may need to stay home longer to avoid infecting others. You can also use a face mask and careful hand washing to prevent spread of the virus. °SEEK MEDICAL CARE IF:  °· After the first few days, you feel you are getting worse rather than better. °· You need your caregiver's advice about medicines to control symptoms. °· You develop chills, worsening shortness of breath, or brown or red sputum. These may be signs of pneumonia. °· You develop yellow or brown nasal discharge or pain in the face, especially when you bend forward. These may be signs of sinusitis. °· You develop a fever, swollen neck glands, pain with swallowing, or white areas in the back of your throat. These may be signs of strep throat. °SEEK IMMEDIATE MEDICAL CARE IF:  °· You have a fever. °· You develop severe or persistent headache, ear pain, sinus pain, or chest pain. °· You develop wheezing, a prolonged cough, cough up blood, or have a change in your usual mucus (if you have chronic lung disease). °· You develop sore muscles or a stiff neck. °Document Released: 05/19/2001 Document Revised: 02/15/2012 Document Reviewed:  02/28/2014 °ExitCare® Patient Information ©2015 [employer]. This information is not intended to replace advice given to you by your health care provider. Make sure you discuss any questions you have with your health care provider. ° °

## 2014-11-15 ENCOUNTER — Encounter (HOSPITAL_COMMUNITY): Payer: Self-pay | Admitting: Cardiovascular Disease

## 2015-04-16 ENCOUNTER — Other Ambulatory Visit (HOSPITAL_BASED_OUTPATIENT_CLINIC_OR_DEPARTMENT_OTHER): Payer: Self-pay

## 2015-04-16 ENCOUNTER — Encounter (HOSPITAL_BASED_OUTPATIENT_CLINIC_OR_DEPARTMENT_OTHER): Payer: Self-pay | Admitting: Emergency Medicine

## 2015-04-16 ENCOUNTER — Observation Stay (HOSPITAL_BASED_OUTPATIENT_CLINIC_OR_DEPARTMENT_OTHER)
Admission: EM | Admit: 2015-04-16 | Discharge: 2015-04-18 | Payer: Non-veteran care | Attending: Emergency Medicine | Admitting: Emergency Medicine

## 2015-04-16 ENCOUNTER — Emergency Department (HOSPITAL_BASED_OUTPATIENT_CLINIC_OR_DEPARTMENT_OTHER): Payer: Non-veteran care

## 2015-04-16 DIAGNOSIS — I252 Old myocardial infarction: Secondary | ICD-10-CM | POA: Insufficient documentation

## 2015-04-16 DIAGNOSIS — I4581 Long QT syndrome: Secondary | ICD-10-CM | POA: Diagnosis not present

## 2015-04-16 DIAGNOSIS — I2510 Atherosclerotic heart disease of native coronary artery without angina pectoris: Secondary | ICD-10-CM | POA: Diagnosis not present

## 2015-04-16 DIAGNOSIS — Z792 Long term (current) use of antibiotics: Secondary | ICD-10-CM | POA: Diagnosis not present

## 2015-04-16 DIAGNOSIS — Z79899 Other long term (current) drug therapy: Secondary | ICD-10-CM | POA: Diagnosis not present

## 2015-04-16 DIAGNOSIS — R0789 Other chest pain: Principal | ICD-10-CM | POA: Insufficient documentation

## 2015-04-16 DIAGNOSIS — R0602 Shortness of breath: Secondary | ICD-10-CM

## 2015-04-16 DIAGNOSIS — Z7982 Long term (current) use of aspirin: Secondary | ICD-10-CM | POA: Diagnosis not present

## 2015-04-16 DIAGNOSIS — F17210 Nicotine dependence, cigarettes, uncomplicated: Secondary | ICD-10-CM | POA: Insufficient documentation

## 2015-04-16 DIAGNOSIS — I110 Hypertensive heart disease with heart failure: Secondary | ICD-10-CM | POA: Insufficient documentation

## 2015-04-16 DIAGNOSIS — I420 Dilated cardiomyopathy: Secondary | ICD-10-CM | POA: Diagnosis not present

## 2015-04-16 DIAGNOSIS — R079 Chest pain, unspecified: Secondary | ICD-10-CM | POA: Diagnosis not present

## 2015-04-16 DIAGNOSIS — I10 Essential (primary) hypertension: Secondary | ICD-10-CM | POA: Diagnosis not present

## 2015-04-16 DIAGNOSIS — I5022 Chronic systolic (congestive) heart failure: Secondary | ICD-10-CM | POA: Diagnosis not present

## 2015-04-16 DIAGNOSIS — I119 Hypertensive heart disease without heart failure: Secondary | ICD-10-CM | POA: Diagnosis present

## 2015-04-16 DIAGNOSIS — R9431 Abnormal electrocardiogram [ECG] [EKG]: Secondary | ICD-10-CM | POA: Insufficient documentation

## 2015-04-16 LAB — BASIC METABOLIC PANEL
Anion gap: 9 (ref 5–15)
BUN: 9 mg/dL (ref 6–20)
CO2: 23 mmol/L (ref 22–32)
Calcium: 8.9 mg/dL (ref 8.9–10.3)
Chloride: 104 mmol/L (ref 101–111)
Creatinine, Ser: 1.03 mg/dL (ref 0.61–1.24)
GFR calc Af Amer: >60 mL/min (ref >60)
GFR calc non Af Amer: >60 mL/min (ref >60)
GLUCOSE: 89 mg/dL (ref 70–99)
POTASSIUM: 4.2 mmol/L (ref 3.5–5.1)
SODIUM: 136 mmol/L (ref 135–145)

## 2015-04-16 LAB — MRSA PCR SCREENING: MRSA BY PCR: NEGATIVE

## 2015-04-16 LAB — CBC
HEMATOCRIT: 47.2 % (ref 39.0–52.0)
Hemoglobin: 16.1 g/dL (ref 13.0–17.0)
MCH: 29.2 pg (ref 26.0–34.0)
MCHC: 34.1 g/dL (ref 30.0–36.0)
MCV: 85.5 fL (ref 78.0–100.0)
Platelets: 231 10*3/uL (ref 150–400)
RBC: 5.52 MIL/uL (ref 4.22–5.81)
RDW: 13.8 % (ref 11.5–15.5)
WBC: 9.3 10*3/uL (ref 4.0–10.5)

## 2015-04-16 LAB — TROPONIN I: Troponin I: 0.03 ng/mL (ref <0.031)

## 2015-04-16 LAB — BRAIN NATRIURETIC PEPTIDE: B NATRIURETIC PEPTIDE 5: 689.5 pg/mL — AB (ref 0.0–100.0)

## 2015-04-16 MED ORDER — ATORVASTATIN CALCIUM 20 MG PO TABS
20.0000 mg | ORAL_TABLET | Freq: Every day | ORAL | Status: DC
  Administered 2015-04-16 – 2015-04-18 (×3): 20 mg via ORAL
  Filled 2015-04-16 (×3): qty 1

## 2015-04-16 MED ORDER — SODIUM CHLORIDE 0.9 % IV SOLN
INTRAVENOUS | Status: AC
  Administered 2015-04-16: 21:00:00 via INTRAVENOUS

## 2015-04-16 MED ORDER — ONDANSETRON HCL 4 MG/2ML IJ SOLN
4.0000 mg | Freq: Four times a day (QID) | INTRAMUSCULAR | Status: DC | PRN
  Administered 2015-04-16 – 2015-04-18 (×5): 4 mg via INTRAVENOUS
  Filled 2015-04-16 (×5): qty 2

## 2015-04-16 MED ORDER — SODIUM CHLORIDE 0.9 % IJ SOLN
3.0000 mL | Freq: Two times a day (BID) | INTRAMUSCULAR | Status: DC
  Administered 2015-04-16 – 2015-04-18 (×3): 3 mL via INTRAVENOUS

## 2015-04-16 MED ORDER — ENOXAPARIN SODIUM 40 MG/0.4ML ~~LOC~~ SOLN
40.0000 mg | SUBCUTANEOUS | Status: DC
  Administered 2015-04-16 – 2015-04-17 (×2): 40 mg via SUBCUTANEOUS
  Filled 2015-04-16 (×3): qty 0.4

## 2015-04-16 MED ORDER — ISOSORB DINITRATE-HYDRALAZINE 20-37.5 MG PO TABS
1.0000 | ORAL_TABLET | Freq: Two times a day (BID) | ORAL | Status: DC
  Administered 2015-04-16 – 2015-04-17 (×2): 1 via ORAL
  Filled 2015-04-16 (×2): qty 1

## 2015-04-16 MED ORDER — ASPIRIN 81 MG PO CHEW
324.0000 mg | CHEWABLE_TABLET | Freq: Once | ORAL | Status: AC
  Administered 2015-04-16: 324 mg via ORAL
  Filled 2015-04-16: qty 4

## 2015-04-16 MED ORDER — ASPIRIN EC 81 MG PO TBEC
81.0000 mg | DELAYED_RELEASE_TABLET | Freq: Every day | ORAL | Status: DC
  Administered 2015-04-17: 81 mg via ORAL
  Filled 2015-04-16: qty 1

## 2015-04-16 MED ORDER — MORPHINE SULFATE 4 MG/ML IJ SOLN
4.0000 mg | Freq: Once | INTRAMUSCULAR | Status: AC
  Administered 2015-04-16: 4 mg via INTRAVENOUS
  Filled 2015-04-16: qty 1

## 2015-04-16 MED ORDER — ACETAMINOPHEN 650 MG RE SUPP: 650.0000 mg | Freq: Four times a day (QID) | RECTAL | Status: DC | PRN

## 2015-04-16 MED ORDER — FUROSEMIDE 10 MG/ML IJ SOLN
40.0000 mg | INTRAMUSCULAR | Status: AC
  Administered 2015-04-16: 40 mg via INTRAVENOUS
  Filled 2015-04-16: qty 4

## 2015-04-16 MED ORDER — NITROGLYCERIN 2 % TD OINT
1.0000 [in_us] | TOPICAL_OINTMENT | Freq: Three times a day (TID) | TRANSDERMAL | Status: DC
  Administered 2015-04-16 – 2015-04-17 (×2): 1 [in_us] via TOPICAL
  Filled 2015-04-16 (×2): qty 30

## 2015-04-16 MED ORDER — METOPROLOL TARTRATE 1 MG/ML IV SOLN
5.0000 mg | Freq: Once | INTRAVENOUS | Status: AC
  Administered 2015-04-16: 5 mg via INTRAVENOUS
  Filled 2015-04-16: qty 5

## 2015-04-16 MED ORDER — LISINOPRIL 20 MG PO TABS
20.0000 mg | ORAL_TABLET | Freq: Every day | ORAL | Status: DC
  Administered 2015-04-16 – 2015-04-18 (×3): 20 mg via ORAL
  Filled 2015-04-16 (×3): qty 1

## 2015-04-16 MED ORDER — FUROSEMIDE 80 MG PO TABS
80.0000 mg | ORAL_TABLET | Freq: Two times a day (BID) | ORAL | Status: DC
  Administered 2015-04-17 – 2015-04-18 (×4): 80 mg via ORAL
  Filled 2015-04-16 (×4): qty 1

## 2015-04-16 MED ORDER — HYDRALAZINE HCL 25 MG PO TABS: 25.0000 mg | ORAL_TABLET | Freq: Two times a day (BID) | ORAL | Status: DC

## 2015-04-16 MED ORDER — HYDRALAZINE HCL 20 MG/ML IJ SOLN: 10.0000 mg | Freq: Four times a day (QID) | INTRAMUSCULAR | Status: DC | PRN

## 2015-04-16 MED ORDER — ONDANSETRON HCL 4 MG/2ML IJ SOLN
4.0000 mg | Freq: Once | INTRAMUSCULAR | Status: AC
  Administered 2015-04-16: 4 mg via INTRAVENOUS
  Filled 2015-04-16: qty 2

## 2015-04-16 MED ORDER — NITROGLYCERIN 0.4 MG SL SUBL
0.4000 mg | SUBLINGUAL_TABLET | SUBLINGUAL | Status: DC | PRN
  Administered 2015-04-16 (×2): 0.4 mg via SUBLINGUAL
  Filled 2015-04-16: qty 1

## 2015-04-16 MED ORDER — ACETAMINOPHEN 325 MG PO TABS
650.0000 mg | ORAL_TABLET | Freq: Four times a day (QID) | ORAL | Status: DC | PRN
  Administered 2015-04-17: 650 mg via ORAL
  Filled 2015-04-16: qty 2

## 2015-04-16 MED ORDER — MORPHINE SULFATE 2 MG/ML IJ SOLN
1.0000 mg | INTRAMUSCULAR | Status: DC | PRN
  Administered 2015-04-17 – 2015-04-18 (×3): 1 mg via INTRAVENOUS
  Filled 2015-04-16 (×3): qty 1

## 2015-04-16 MED ORDER — CARVEDILOL 12.5 MG PO TABS
12.5000 mg | ORAL_TABLET | Freq: Two times a day (BID) | ORAL | Status: DC
  Administered 2015-04-17 – 2015-04-18 (×4): 12.5 mg via ORAL
  Filled 2015-04-16 (×2): qty 1
  Filled 2015-04-16: qty 2
  Filled 2015-04-16 (×2): qty 1

## 2015-04-16 NOTE — ED Notes (Signed)
Phone report given to [facility] Transport Team 

## 2015-04-16 NOTE — ED Notes (Signed)
MD at bedside. 

## 2015-04-16 NOTE — ED Notes (Signed)
[facility] knows of room--3E02C to transfer

## 2015-04-16 NOTE — ED Notes (Signed)
Pt stood at bedside, gait very steady, tolerating moving self well, voided into urinal 175 dark colored urine, no odor

## 2015-04-16 NOTE — ED Notes (Signed)
PA-C in to re-evaluate pt, updated on pt status by this RN

## 2015-04-16 NOTE — ED Notes (Signed)
Pt states min relief from IV morphine administered earlier, states "I feel so tire", NSR cont to be noted on monitor, resp even and less labored since admission to ED, remains on cont pox and cardiac monitor.

## 2015-04-16 NOTE — ED Notes (Signed)
Pt requested graham crackers/ apple juice.  Tolerating PO's,

## 2015-04-16 NOTE — ED Notes (Signed)
Pt denies wt gain, no edema noted in ankles, no JVD noted at this time

## 2015-04-16 NOTE — ED Provider Notes (Signed)
CSN: [id number]     Arrival date & time 04/16/15  1224 History   First MD Initiated Contact with Patient 04/16/15 1234     Chief Complaint  Patient presents with  . Chest Pain  . Shortness of Breath     (Consider location/radiation/quality/duration/timing/severity/associated sxs/prior Treatment) Patient is a 49 y.o. male presenting with chest pain and shortness of breath. The history is provided by the patient and medical records.  Chest Pain Associated symptoms: diaphoresis, nausea, shortness of breath and vomiting   Shortness of Breath Associated symptoms: chest pain, diaphoresis and vomiting     This is a 49 y.o. M with PMH significant for HTN, CHF, non-obstructive CAD, GERD, presenting to the ED for chest pain and SOB.  Patient states symptoms began around 0600 this morning, states not sure if it woke him from sleep or he just noticed it when he woke up.  States chest pain is like a pressure in his central chest with associated SOB, diaphoresis, nausea, and non-bloody, non-bilious emesis x2.  Patient states he has had a recent cough but denies any known fever. He denies any known sick contacts. Patient is followed by cardiology, Dr. Jens Som. He states he has been compliant with all his medications.  No noted significant weight gain.  Patient is not home O2 dependent.  Past Medical History  Diagnosis Date  . Hypertension   . Complication of anesthesia     It does not last long enough " I wake up I feel everything "  . Chronic systolic CHF (congestive heart failure)     a. 03/2013 Echo: EF 30%, diff HK, worse in inf/infsept/apical walls, PASP  . NICM (nonischemic cardiomyopathy)     a. 03/2013 Echo: EF 30%.  Marland Kitchen CAD (coronary artery disease) Non-obstructive    a. 03/2014 Cath: Elevated R heart pressures, LM nl, LAD 40p, D1/2 nl, LCX nl, RCA nl.  . H/O hiatal hernia   . GERD (gastroesophageal reflux disease)    Past Surgical History  Procedure Laterality Date  . Cholecystectomy     . Left knee surgery    . Coronary angiogram  03/29/14    NL cors  . Left and right heart catheterization with coronary angiogram N/A 03/29/2014    Procedure: LEFT AND RIGHT HEART CATHETERIZATION WITH CORONARY ANGIOGRAM;  Surgeon: Micheline Chapman, MD;  Location: [facility] CATH LAB;  Service: Cardiovascular;  Laterality: N/A;   Family History  Problem Relation Age of Onset  . Heart disease Father     Stent and CHF   History  Substance Use Topics  . Smoking status: Current Some Day Smoker -- 0.50 packs/day for 35 years    Types: Cigarettes  . Smokeless tobacco: Never Used     Comment: Pt has not smoked since the monday before last.  . Alcohol Use: No    Review of Systems  Constitutional: Positive for diaphoresis.  Respiratory: Positive for shortness of breath.   Cardiovascular: Positive for chest pain.  Gastrointestinal: Positive for nausea and vomiting.  All other systems reviewed and are negative.     Allergies  Review of patient's allergies indicates no known allergies.  Home Medications   Prior to Admission medications   Medication Sig Start Date End Date Taking? Authorizing Provider  aspirin EC 81 MG EC tablet Take 1 tablet (81 mg total) by mouth daily. 03/31/14   Ok Anis, NP  atorvastatin (LIPITOR) 10 MG tablet Take 20 mg by mouth daily. 03/31/14   Alcario Drought  Brion Aliment, NP  carvedilol (COREG) 6.25 MG tablet Take 12.5 mg by mouth 2 (two) times daily with a meal. 03/31/14   Ok Anis, NP  doxycycline (VIBRAMYCIN) 100 MG capsule Take 1 capsule (100 mg total) by mouth 2 (two) times daily. One po bid x 7 days 07/01/14   Rolan Bucco, MD  furosemide (LASIX) 40 MG tablet Take 2 tablets (80 mg total) by mouth 2 (two) times daily. 04/04/14   Lewayne Bunting, MD  hydrALAZINE (APRESOLINE) 25 MG tablet Take 25 mg by mouth 2 times daily at 12 noon and 4 pm.    Historical Provider, MD  isosorbide-hydrALAZINE (BIDIL) 20-37.5 MG per tablet Take 1 tablet by mouth 2 (two)  times daily. 03/31/14   Ok Anis, NP  levofloxacin (LEVAQUIN) 750 MG tablet Take 1 tablet (750 mg total) by mouth daily. 04/08/14   Marinda Elk, MD  lisinopril (PRINIVIL,ZESTRIL) 40 MG tablet Take 20 mg by mouth daily. 03/31/14   Ok Anis, NP   BP 161/103 mmHg  Pulse 100  Temp(Src) 98.3 F (36.8 C) (Oral)  Ht  (1.778 m)  Wt 185 lb (83.915 kg)  BMI 26.54 kg/m2  SpO2 100%   Physical Exam  Constitutional: He is oriented to person, place, and time. He appears well-developed and well-nourished. No distress.  HENT:  Head: Normocephalic and atraumatic.  Mouth/Throat: Oropharynx is clear and moist.  Eyes: Conjunctivae and EOM are normal. Pupils are equal, round, and reactive to light.  Neck: Normal range of motion. Neck supple.  Cardiovascular: Normal rate, regular rhythm and normal heart sounds.   Pulmonary/Chest: Breath sounds normal. No respiratory distress. He has no wheezes.  Abdominal: Soft. Bowel sounds are normal. There is no tenderness. There is no guarding.  Musculoskeletal: Normal range of motion. He exhibits no edema.  No noted calf asymmetry, tenderness, or palpable cords; no overlying skin changes or warmth to touch No noted pitting edema  Neurological: He is alert and oriented to person, place, and time.  Skin: Skin is warm. He is diaphoretic (mild).  Psychiatric: He has a normal mood and affect.  Nursing note and vitals reviewed.   ED Course  Procedures (including critical care time) Labs Review Labs Reviewed  BRAIN NATRIURETIC PEPTIDE - Abnormal; Notable for the following:    B Natriuretic Peptide 689.5 (*)    All other components within normal limits  CBC  BASIC METABOLIC PANEL  TROPONIN I    Imaging Review Dg Chest Port 1 View  04/16/2015   CLINICAL DATA:  Chest pain and shortness of breath  EXAM: PORTABLE CHEST - 1 VIEW  COMPARISON:  July 01, 2014  FINDINGS: Lungs are clear. Heart is borderline enlarged with pulmonary  vascularity within normal limits. No adenopathy no pneumothorax. No bone lesions.  IMPRESSION: Heart prominent but stable.  No edema or consolidation.   Electronically Signed   By: Bretta Bang III M.D.   On: 04/16/2015 13:29     EKG Interpretation   Date/Time:  Tuesday Apr 16 2015 12:29:42 EDT Ventricular Rate:  106 PR Interval:  144 QRS Duration: 96 QT Interval:  394 QTC Calculation: 523 R Axis:   0 Text Interpretation:  Sinus tachycardia Biatrial enlargement Left  ventricular hypertrophy ST \\T \ T wave abnormality, consider inferolateral  ischemia Prolonged QT Abnormal ECG No significant change since last  tracing Confirmed by KNAPP  MD-J, JON ([id number]) on 04/16/2015 12:32:18 PM      MDM   Final diagnoses:  Chest  pain, unspecified chest pain type  Shortness of breath  Essential hypertension   49 year old male here with chest pain and shortness of breath since around 0600 this morning. He does have history of CHF but denies recent weight gain. He is not oxygen dependent. On exam, patient appears uncomfortable and is mildly diaphoretic. He is in no acute respiratory distress. He is mildly tachycardic and hypertensive. He states he has been compliant with all his medications. EKG with T-wave inversions, however these are not new. Lab work is reassuring aside from elevated BNP at 689 however this is much lower than during previous admissions.  CXR without noted pulmonary edema or infiltrate.  Patient was given aspirin, sublingual nitroglycerin, and morphine with improvement of his pain. He was also given additional dose of IV Lasix.  Given his history and presentation today, feel patient would benefit from admission and close monitoring. Case was discussed with hospitalist, Dr. Butler Denmark, who will admit to tele, obs.  Also recommended additional 5mg  IV lopressor which was given.  EMTALA completed, patient will be transferred to [facility].  5:06 PM  Patient reports active pain  again, receiving unit as [place] recommends upgrade to step-down which was done, however no beds available.  Inquired about transferring [facility], however flow manager there states [facility] needs to stay at [facility] as this is a cardiac patient.  Nursing staff has discussed this with bed assignment at [facility] and new assignment given.  6:16 PM [facility] here for transport to [facility].  Patient remains stable and safe for transport.  Garlon Hatchet, PA-C 04/16/15 1817  Linwood Dibbles, MD 04/17/15 [phone]

## 2015-04-16 NOTE — ED Notes (Signed)
Recd phone call from original nsg unit assigned for pt, discussed plan of care and pt cont c/o chest pressure, 4-5/ on 0-10 scale, stated that with pt still having active chest pain/ pressure pt would need higher level of care, EDP and PA-C informed of conversation, level of care changed and new bed assignment requested

## 2015-04-16 NOTE — H&P (Signed)
Mark Horton is an 49 y.o. male.    Lopeno (cardiologist)  Chief Complaint: chest pain HPI: 49 yo male with htn, chf (EF 30%), CAD, c/o chest pain.started about 6am this am at rest.  sscp "pressure" with radiation to the back.  Pt had tingling in his fingers and some diaphoresis. And sob.  + cough.  Fairly constant.  Pt went to [facility] for evaluation,  Trop negative. CXR negative.  Slg nitro x2 without relief. Pt will be admitted for w/up of chest pain.   Past Medical History  Diagnosis Date  . Hypertension   . Complication of anesthesia     It does not last long enough " I wake up I feel everything "  . Chronic systolic CHF (congestive heart failure)     a. 03/2013 Echo: EF 30%, diff HK, worse in inf/infsept/apical walls, PASP 13mHg  . NICM (nonischemic cardiomyopathy)     a. 03/2013 Echo: EF 30%.  .Marland KitchenCAD (coronary artery disease) Non-obstructive    a. 03/2014 Cath: Elevated R heart pressures, LM nl, LAD 40p, D1/2 nl, LCX nl, RCA nl.  . H/O hiatal hernia   . GERD (gastroesophageal reflux disease)     Past Surgical History  Procedure Laterality Date  . Cholecystectomy    . Left knee surgery    . Coronary angiogram  03/29/14    NL cors  . Left and right heart catheterization with coronary angiogram N/A 03/29/2014    Procedure: LEFT AND RIGHT HEART CATHETERIZATION WITH CORONARY ANGIOGRAM;  Surgeon: MBlane Ohara MD;  [facility];  Service: Cardiovascular;  Laterality: N/A;    Family History  Problem Relation Age of Onset  . Heart disease Father     Stent and CHF  . Hypertension Mother   . Diabetes Mother   . Fibromyalgia Mother    Social History:  reports that he has been smoking Cigarettes.  He has a 17.5 pack-year smoking history. He has never used smokeless tobacco. He reports that he does not drink alcohol or use illicit drugs.  Allergies: No Known Allergies  Medications Prior to Admission  Medication Sig Dispense Refill   . aspirin EC 81 MG EC tablet Take 1 tablet (81 mg total) by mouth daily.    .Marland Kitchenatorvastatin (LIPITOR) 10 MG tablet Take 20 mg by mouth daily.    . carvedilol (COREG) 6.25 MG tablet Take 12.5 mg by mouth 2 (two) times daily with a meal.    . doxycycline (VIBRAMYCIN) 100 MG capsule Take 1 capsule (100 mg total) by mouth 2 (two) times daily. One po bid x 7 days 14 capsule 0  . furosemide (LASIX) 40 MG tablet Take 2 tablets (80 mg total) by mouth 2 (two) times daily. 60 tablet 12  . hydrALAZINE (APRESOLINE) 25 MG tablet Take 25 mg by mouth 2 times daily at 12 noon and 4 pm.    . isosorbide-hydrALAZINE (BIDIL) 20-37.5 MG per tablet Take 1 tablet by mouth 2 (two) times daily. 60 tablet 6  . levofloxacin (LEVAQUIN) 750 MG tablet Take 1 tablet (750 mg total) by mouth daily. 4 tablet 0  . lisinopril (PRINIVIL,ZESTRIL) 40 MG tablet Take 20 mg by mouth daily.      Results for orders placed or performed during the hospital encounter of 04/16/15 (from the past 48 hour(s))  CBC     Status: None   Collection Time: 04/16/15 12:45 PM  Result Value Ref Range  WBC 9.3 4.0 - 10.5 K/uL   RBC 5.52 4.22 - 5.81 MIL/uL   Hemoglobin 16.1 13.0 - 17.0 g/dL   HCT 47.2 39.0 - 52.0 %   MCV 85.5 78.0 - 100.0 fL   MCH 29.2 26.0 - 34.0 pg   MCHC 34.1 30.0 - 36.0 g/dL   RDW 13.8 11.5 - 15.5 %   Platelets 231 150 - 400 K/uL  Basic metabolic panel     Status: None   Collection Time: 04/16/15 12:45 PM  Result Value Ref Range   Sodium 136 135 - 145 mmol/L   Potassium 4.2 3.5 - 5.1 mmol/L   Chloride 104 101 - 111 mmol/L   CO2 23 22 - 32 mmol/L   Glucose, Bld 89 70 - 99 mg/dL   BUN 9 6 - 20 mg/dL   Creatinine, Ser 1.03 0.61 - 1.24 mg/dL   Calcium 8.9 8.9 - 10.3 mg/dL   GFR calc non Af Amer >60 >60 mL/min   GFR calc Af Amer >60 >60 mL/min    Comment: (NOTE) The eGFR has been calculated using the CKD EPI equation. This calculation has not been validated in all clinical situations. eGFR's persistently <60 mL/min  signify possible Chronic Kidney Disease.    Anion gap 9 5 - 15  Troponin I     Status: None   Collection Time: 04/16/15 12:45 PM  Result Value Ref Range   Troponin I 0.03 <0.031 ng/mL    Comment:        NO INDICATION OF MYOCARDIAL INJURY.   Brain natriuretic peptide     Status: Abnormal   Collection Time: 04/16/15 12:45 PM  Result Value Ref Range   B Natriuretic Peptide 689.5 (H) 0.0 - 100.0 pg/mL   Dg Chest Port 1 View  04/16/2015   CLINICAL DATA:  Chest pain and shortness of breath  EXAM: PORTABLE CHEST - 1 VIEW  COMPARISON:  July 01, 2014  FINDINGS: Lungs are clear. Heart is borderline enlarged with pulmonary vascularity within normal limits. No adenopathy no pneumothorax. No bone lesions.  IMPRESSION: Heart prominent but stable.  No edema or consolidation.   Electronically Signed   By: Lowella Grip III M.D.   On: 04/16/2015 13:29    Review of Systems  Constitutional: Negative.   HENT: Negative.   Eyes: Negative.   Respiratory: Positive for cough and shortness of breath. Negative for hemoptysis, sputum production and wheezing.   Cardiovascular: Positive for chest pain. Negative for palpitations, orthopnea, claudication, leg swelling and PND.  Gastrointestinal: Negative.   Genitourinary: Negative.   Musculoskeletal: Negative.   Skin: Negative.   Neurological: Negative.   Endo/Heme/Allergies: Negative.   Psychiatric/Behavioral: Negative.     Blood pressure 162/122, pulse 96, temperature 98 F (36.7 C), temperature source Oral, resp. rate 15, height _0  (1.778 m), weight 83.915 kg (185 lb), SpO2 99 %. Physical Exam  Constitutional: He is oriented to person, place, and time. He appears well-developed and well-nourished.  HENT:  Head: Normocephalic and atraumatic.  Mouth/Throat: No oropharyngeal exudate.  Eyes: Conjunctivae and EOM are normal. Pupils are equal, round, and reactive to light. No scleral icterus.  Neck: Normal range of motion. Neck supple. No JVD  present. No tracheal deviation present. No thyromegaly present.  Cardiovascular: Normal rate and regular rhythm.  Exam reveals no gallop and no friction rub.   No murmur heard. Respiratory: Effort normal and breath sounds normal. No respiratory distress. He has no wheezes. He has no rales.  GI: Soft.  Bowel sounds are normal. He exhibits no distension. There is no tenderness. There is no rebound and no guarding.  Musculoskeletal: Normal range of motion. He exhibits no edema or tenderness.  Lymphadenopathy:    He has no cervical adenopathy.  Neurological: He is alert and oriented to person, place, and time. He has normal reflexes. He displays normal reflexes. No cranial nerve deficit. He exhibits normal muscle tone. Coordination normal.  Skin: Skin is warm and dry. No rash noted. No erythema. No pallor.  Psychiatric: He has a normal mood and affect. His behavior is normal. Judgment and thought content normal.     Assessment/Plan Chest pain Check CTA chest due to complaints of radiation of pain to back to r/o aortic dissection Trop i q6h x3 Check echo  NPO after mn Nuclear stress test in am Start on aspirin 317m poq day, cont lipitor, start on nitropaste, cont metoprolol  Hypertension Add nitropaste Hydralazine 149miv q6h prn sbp>160  CHF (ef 30%) Cont lasix 8043mo bid, cont metoprolol, cont hydralazine, cont bidil  DVT prophylaxis: scd, lovenox   Bronnie Vasseur 04/16/2015, 7:55 PM

## 2015-04-16 NOTE — Progress Notes (Addendum)
49 year old male presenting with chest pain. Being admitted to rule out MI. Has a history of nonobstructive coronary artery disease, congestive heart failure, GERD, hypertension. Follows with Dr. Jens Som. Cardiac cath in April 2015 showed minimal nonobstructive coronary artery disease. BP still slightly elevated in the ER and I have asked ER PA to administer 5 mg of IV Lopressor. The patient will be admitted to a telemetry bed[facility]

## 2015-04-16 NOTE — ED Notes (Signed)
Reports poor appetite, c/o nausea

## 2015-04-16 NOTE — ED Notes (Signed)
Family at bedside. Explained care being provided and admission

## 2015-04-16 NOTE — ED Notes (Addendum)
Pt in c/o of chest pressure and SOB, breathing is equal and unlabored. States hx of CHF. O2 sats WDL, but pt is hypertensive.

## 2015-04-16 NOTE — ED Notes (Signed)
Admission discussed with pt by ED provider, pt agrees to being transferred for inpatient care

## 2015-04-17 ENCOUNTER — Observation Stay (HOSPITAL_COMMUNITY): Payer: Non-veteran care

## 2015-04-17 ENCOUNTER — Encounter (HOSPITAL_COMMUNITY): Payer: Self-pay | Admitting: Radiology

## 2015-04-17 DIAGNOSIS — R079 Chest pain, unspecified: Secondary | ICD-10-CM | POA: Insufficient documentation

## 2015-04-17 DIAGNOSIS — I4581 Long QT syndrome: Secondary | ICD-10-CM | POA: Diagnosis not present

## 2015-04-17 DIAGNOSIS — I10 Essential (primary) hypertension: Secondary | ICD-10-CM | POA: Diagnosis not present

## 2015-04-17 DIAGNOSIS — I110 Hypertensive heart disease with heart failure: Secondary | ICD-10-CM | POA: Diagnosis not present

## 2015-04-17 DIAGNOSIS — I509 Heart failure, unspecified: Secondary | ICD-10-CM | POA: Diagnosis not present

## 2015-04-17 DIAGNOSIS — R9431 Abnormal electrocardiogram [ECG] [EKG]: Secondary | ICD-10-CM | POA: Insufficient documentation

## 2015-04-17 LAB — NM MYOCAR MULTI W/SPECT W/WALL MOTION / EF
CHL CUP NUCLEAR SDS: 1
LV dias vol: 297 mL
LV sys vol: 207 mL
NUC STRESS EF: 30 %
RATE: 0.58
SRS: 6
SSS: 7
TID: 1.01

## 2015-04-17 LAB — TROPONIN I: Troponin I: <0.03 ng/mL (ref <0.031)

## 2015-04-17 MED ORDER — ASPIRIN EC 81 MG PO TBEC: 81.0000 mg | DELAYED_RELEASE_TABLET | Freq: Every day | ORAL | Status: DC

## 2015-04-17 MED ORDER — SODIUM CHLORIDE 0.9 % IJ SOLN
3.0000 mL | Freq: Two times a day (BID) | INTRAMUSCULAR | Status: DC
  Administered 2015-04-18: 3 mL via INTRAVENOUS

## 2015-04-17 MED ORDER — SODIUM CHLORIDE 0.9 % WEIGHT BASED INFUSION
1.0000 mL/kg/h | INTRAVENOUS | Status: DC
  Administered 2015-04-17: 1 mL/kg/h via INTRAVENOUS

## 2015-04-17 MED ORDER — ASPIRIN 81 MG PO CHEW
81.0000 mg | CHEWABLE_TABLET | ORAL | Status: AC
  Administered 2015-04-18: 81 mg via ORAL
  Filled 2015-04-17: qty 1

## 2015-04-17 MED ORDER — SODIUM CHLORIDE 0.9 % IJ SOLN: 3.0000 mL | INTRAMUSCULAR | Status: DC | PRN

## 2015-04-17 MED ORDER — TECHNETIUM TC 99M SESTAMIBI GENERIC - CARDIOLITE
10.0000 | Freq: Once | INTRAVENOUS | Status: AC | PRN
  Administered 2015-04-17: 10 via INTRAVENOUS

## 2015-04-17 MED ORDER — IOHEXOL 350 MG/ML SOLN
80.0000 mL | Freq: Once | INTRAVENOUS | Status: AC | PRN
  Administered 2015-04-17: 80 mL via INTRAVENOUS

## 2015-04-17 MED ORDER — SODIUM CHLORIDE 0.9 % IV SOLN: 250.0000 mL | INTRAVENOUS | Status: DC | PRN

## 2015-04-17 MED ORDER — REGADENOSON 0.4 MG/5ML IV SOLN
INTRAVENOUS | Status: AC
  Filled 2015-04-17: qty 5

## 2015-04-17 MED ORDER — HYDRALAZINE HCL 25 MG PO TABS
25.0000 mg | ORAL_TABLET | Freq: Two times a day (BID) | ORAL | Status: DC
  Administered 2015-04-17 – 2015-04-18 (×2): 25 mg via ORAL
  Filled 2015-04-17 (×2): qty 1

## 2015-04-17 MED ORDER — ISOSORBIDE DINITRATE 10 MG PO TABS
20.0000 mg | ORAL_TABLET | Freq: Two times a day (BID) | ORAL | Status: DC
  Administered 2015-04-17 – 2015-04-18 (×2): 20 mg via ORAL
  Filled 2015-04-17 (×2): qty 2

## 2015-04-17 MED ORDER — TECHNETIUM TC 99M SESTAMIBI GENERIC - CARDIOLITE
30.0000 | Freq: Once | INTRAVENOUS | Status: AC | PRN
  Administered 2015-04-17: 30 via INTRAVENOUS

## 2015-04-17 MED ORDER — REGADENOSON 0.4 MG/5ML IV SOLN
0.4000 mg | Freq: Once | INTRAVENOUS | Status: AC
  Administered 2015-04-17: 0.4 mg via INTRAVENOUS
  Filled 2015-04-17: qty 5

## 2015-04-17 MED ORDER — GUAIFENESIN-DM 100-10 MG/5ML PO SYRP: 5.0000 mL | ORAL_SOLUTION | ORAL | Status: DC | PRN

## 2015-04-17 NOTE — Consult Note (Signed)
Reason for Consult: Chest pain  Requesting Physician: Arthor Captain  Cardiologist: Jens Som  HPI: This is a 49 y.o. male with a past medical history significant for minor CAD at cath 1 year ago (40% LAD) and HTNive heart disease with nonischemic cardiomyopathy and LVEF 30% presents with chest pressure radiating to his back, not helped by SL NTG. Severely elevated BP. ECG is abnormal but unchanged. Troponin normal x 3. Undergoing stress test this morning,with plan for CT angio to exclude aortic dissection as well. CXR does not show CHF, degree of cardiomegaly is unchanged.  PMHx:  Past Medical History  Diagnosis Date  . Hypertension   . Complication of anesthesia     It does not last long enough " I wake up I feel everything "  . Chronic systolic CHF (congestive heart failure)     a. 03/2013 Echo: EF 30%, diff HK, worse in inf/infsept/apical walls, PASP  . NICM (nonischemic cardiomyopathy)     a. 03/2013 Echo: EF 30%.  Marland Kitchen CAD (coronary artery disease) Non-obstructive    a. 03/2014 Cath: Elevated R heart pressures, LM nl, LAD 40p, D1/2 nl, LCX nl, RCA nl.  . H/O hiatal hernia   . GERD (gastroesophageal reflux disease)    Past Surgical History  Procedure Laterality Date  . Cholecystectomy    . Left knee surgery    . Coronary angiogram  03/29/14    NL cors  . Left and right heart catheterization with coronary angiogram N/A 03/29/2014    Procedure: LEFT AND RIGHT HEART CATHETERIZATION WITH CORONARY ANGIOGRAM;  Surgeon: Micheline Chapman, MD;  Location: [facility] CATH LAB;  Service: Cardiovascular;  Laterality: N/A;    FAMHx: Family History  Problem Relation Age of Onset  . Heart disease Father     Stent and CHF  . Hypertension Mother   . Diabetes Mother   . Fibromyalgia Mother     SOCHx:  reports that he has been smoking Cigarettes.  He has a 17.5 pack-year smoking history. He has never used smokeless tobacco. He reports that he does not drink alcohol or use illicit  drugs.  ALLERGIES: No Known Allergies  ROS: Constitutional: Negative.  HENT: Negative.  Eyes: Negative.  Respiratory: Positive for cough and shortness of breath. Negative for hemoptysis, sputum production and wheezing.  Cardiovascular: Positive for chest pain. Negative for palpitations, orthopnea, claudication, leg swelling and PND.  Gastrointestinal: Negative.  Genitourinary: Negative.  Musculoskeletal: Negative.  Skin: Negative.  Neurological: Negative.  Endo/Heme/Allergies: Negative.  Psychiatric/Behavioral: Negative.    HOME MEDICATIONS: I have reviewed the patient's current medications. Prior to Admission:  Prescriptions prior to admission  Medication Sig Dispense Refill Last Dose  . albuterol (PROVENTIL HFA;VENTOLIN HFA) 108 (90 BASE) MCG/ACT inhaler Inhale 2 puffs into the lungs every 6 (six) hours as needed for wheezing or shortness of breath.   04/12/2015  . atorvastatin (LIPITOR) 10 MG tablet Take 20 mg by mouth daily at 6 PM.    04/14/2015 at Unknown time  . furosemide (LASIX) 40 MG tablet Take 2 tablets (80 mg total) by mouth 2 (two) times daily. 60 tablet 12 04/15/2015 at Unknown time  . hydrALAZINE (APRESOLINE) 25 MG tablet Take 37.5 mg by mouth 2 times daily at 12 noon and 4 pm.    04/15/2015 at Unknown time  . isosorbide dinitrate (ISORDIL) 20 MG tablet Take 20 mg by mouth 2 (two) times daily.   04/15/2015 at Unknown time  . lisinopril (PRINIVIL,ZESTRIL) 40 MG tablet Take  30 mg by mouth daily.    04/15/2015 at Unknown time  . omeprazole (PRILOSEC) 20 MG capsule Take 20 mg by mouth daily.   04/15/2015 at Unknown time  . aspirin EC 81 MG EC tablet Take 1 tablet (81 mg total) by mouth daily. (Patient not taking: Reported on 04/16/2015)   Not Taking at Unknown time  . doxycycline (VIBRAMYCIN) 100 MG capsule Take 1 capsule (100 mg total) by mouth 2 (two) times daily. One po bid x 7 days (Patient not taking: Reported on 04/16/2015) 14 capsule 0 Not Taking at Unknown time  .  isosorbide-hydrALAZINE (BIDIL) 20-37.5 MG per tablet Take 1 tablet by mouth 2 (two) times daily. (Patient not taking: Reported on 04/16/2015) 60 tablet 6 Not Taking at Unknown time  . levofloxacin (LEVAQUIN) 750 MG tablet Take 1 tablet (750 mg total) by mouth daily. (Patient not taking: Reported on 04/16/2015) 4 tablet 0 Not Taking at Unknown time    VITALS: Blood pressure 123/75, pulse 77, temperature 98.6 F (37 C), temperature source Oral, resp. rate 18, height 5\' 10"  (1.778 m), weight 179 lb 0.2 oz (81.2 kg), SpO2 99 %.  PHYSICAL EXAM:  General: Alert, oriented x3, no distress Head: no evidence of trauma, PERRL, EOMI, no exophtalmos or lid lag, no myxedema, no xanthelasma; normal ears, nose and oropharynx Neck: normal jugular venous pulsations and no hepatojugular reflux; brisk carotid pulses without delay and no carotid bruits Chest: clear to auscultation, no signs of consolidation by percussion or palpation, normal fremitus, symmetrical and full respiratory excursions Cardiovascular: normal position and quality of the apical impulse, regular rhythm, normal first heart sound and normal second heart sound, no rubs or gallops, no murmur Abdomen: no tenderness or distention, no masses by palpation, no abnormal pulsatility or arterial bruits, normal bowel sounds, no hepatosplenomegaly Extremities: no clubbing, cyanosis;  no edema; 2+ radial, ulnar and brachial pulses bilaterally; 2+ right femoral, posterior tibial and dorsalis pedis pulses; 2+ left femoral, posterior tibial and dorsalis pedis pulses; no subclavian or femoral bruits Neurological: grossly nonfocal   LABS  CBC  Recent Labs  04/16/15 1245  WBC 9.3  HGB 16.1  HCT 47.2  MCV 85.5  PLT 231   Basic Metabolic Panel  Recent Labs  04/16/15 1245  NA 136  K 4.2  CL 104  CO2 23  GLUCOSE 89  BUN 9  CREATININE 1.03  CALCIUM 8.9   Liver Function Tests No results for input(s): AST, ALT, ALKPHOS, BILITOT, PROT, ALBUMIN in  the last 72 hours. No results for input(s): LIPASE, AMYLASE in the last 72 hours. Cardiac Enzymes  Recent Labs  04/16/15 1245 04/16/15 2146 04/17/15 0250  TROPONINI 0.03 <0.03 <0.03    IMAGING: Dg Chest Port 1 View  04/16/2015   CLINICAL DATA:  Chest pain and shortness of breath  EXAM: PORTABLE CHEST - 1 VIEW  COMPARISON:  July 01, 2014  FINDINGS: Lungs are clear. Heart is borderline enlarged with pulmonary vascularity within normal limits. No adenopathy no pneumothorax. No bone lesions.  IMPRESSION: Heart prominent but stable.  No edema or consolidation.   Electronically Signed   By: Bretta Bang III M.D.   On: 04/16/2015 13:29    ECG: NSR, LVH with secondary repol changes and prolonged QT (no change from 2015)  TELEMETRY: NSR  IMPRESSION/RECOMMENDATION: 1. Suspect chest symptoms are not due to an acute coronary syndrome, but may be reflective of subendocardial ischemia due to severe HTN and heart failure 2. Evaluation for aortic dissection is  appropriate 3. Not overtly hypervolemic. Add carvedilol for BP and CHF   Thurmon Fair, MD, [facility] [phone] office [phone] pager   04/17/2015, 8:07 AM

## 2015-04-17 NOTE — Progress Notes (Signed)
lexiscan myoview completed without complications.  Results to follow later today.

## 2015-04-17 NOTE — Progress Notes (Signed)
nuc stress test is abnormal.  Discussed with Dr. Judie Petit. Croitoru and pt will need cardiac cath in AM.  We have arranged with Dr. Clifton James.  Pt will be NPO after MN and we will begin fluids at 50 /hr for gentle hydration.  Recheck BMP in AM.  I have discussed with pt .  The patient understands that risks for cardiac cath  included but are not limited to stroke (1 in 1000), death (1 in 1000), kidney failure [usually temporary] (1 in 500), bleeding (1 in 200), allergic reaction [possibly serious] (1 in 200).  Pt has had before and is agreeable to undergo.

## 2015-04-17 NOTE — Progress Notes (Signed)
Seven beat run V-tach, vital signs stable, denies chest pain or SOB, MD notified, will continue to monitor closely.

## 2015-04-17 NOTE — Progress Notes (Signed)
[facility] PROGRESS NOTE   Mark Horton FBP:[id number] DOB: 1966-04-21 DOA: 04/16/2015 PCP: No PCP Per Patient  HPI/Subjective: Still complaining about minimal soreness, chest pain is reproducible.  Assessment/Plan: Active Problems:   Hypertensive heart disease   Chest pain   Pain in the chest   Long QT interval   Chest pain Presented with atypical chest pain, substernal pressure-like but nitroglycerin or rest did not help it. History of dilated cardiomyopathy, nonischemic CAD, LVEF of 30%. Clean coronaries on cath in April 2015. Undergone stress test earlier today, results pending.  Hypertension On BiDil, Coreg added, one aspirin.  Chronic CHF/dilated cardiomyopathy. Patient is on Lasix 80 mg by mouth twice a day, no evidence of decompensation continue same dose. Continue BiDil, metoprolol switched to Coreg.  Code Status: Full Code Family Communication: Plan discussed with the patient. Disposition Plan: Remains inpatient Diet: Diet NPO time specified Except for: Sips with Meds  Consultants:  Cardiology  Procedures:  Stress test, results pending  Antibiotics:  None   Objective: Filed Vitals:   04/17/15 1011  BP: 117/72  Pulse:   Temp:   Resp:     Intake/Output Summary (Last 24 hours) at 04/17/15 1218 Last data filed at 04/17/15 0800  Gross per 24 hour  Intake      0 ml  Output    950 ml  Net   -950 ml   Filed Weights   04/16/15 1230 04/17/15 0521  Weight: 83.915 kg (185 lb) 81.2 kg (179 lb 0.2 oz)    Exam: General: Alert and awake, oriented x3, not in any acute distress. HEENT: anicteric sclera, pupils reactive to light and accommodation, EOMI CVS: S1-S2 clear, no murmur rubs or gallops Chest: clear to auscultation bilaterally, no wheezing, rales or rhonchi Abdomen: soft nontender, nondistended, normal bowel sounds, no organomegaly Extremities: no cyanosis, clubbing or edema noted bilaterally Neuro: Cranial nerves II-XII intact, no  focal neurological deficits  Data Reviewed: Basic Metabolic Panel:  Recent Labs Lab 04/16/15 1245  NA 136  K 4.2  CL 104  CO2 23  GLUCOSE 89  BUN 9  CREATININE 1.03  CALCIUM 8.9   Liver Function Tests: No results for input(s): AST, ALT, ALKPHOS, BILITOT, PROT, ALBUMIN in the last 168 hours. No results for input(s): LIPASE, AMYLASE in the last 168 hours. No results for input(s): AMMONIA in the last 168 hours. CBC:  Recent Labs Lab 04/16/15 1245  WBC 9.3  HGB 16.1  HCT 47.2  MCV 85.5  PLT 231   Cardiac Enzymes:  Recent Labs Lab 04/16/15 1245 04/16/15 2146 04/17/15 0250 04/17/15 0752  TROPONINI 0.03 <0.03 <0.03 <0.03   BNP (last 3 results)  Recent Labs  04/16/15 1245  BNP 689.5*    ProBNP (last 3 results)  Recent Labs  07/01/14 1935  PROBNP 2393.0*    CBG: No results for input(s): GLUCAP in the last 168 hours.  Micro Recent Results (from the past 240 hour(s))  MRSA PCR Screening     Status: None   Collection Time: 04/16/15  6:50 PM  Result Value Ref Range Status   MRSA by PCR NEGATIVE NEGATIVE Final    Comment:        The GeneXpert MRSA Assay (FDA approved for NASAL specimens only), is one component of a comprehensive MRSA colonization surveillance program. It is not intended to diagnose MRSA infection nor to guide or monitor treatment for MRSA infections.      Studies: Dg Chest Port 1 View  04/16/2015   CLINICAL  DATA:  Chest pain and shortness of breath  EXAM: PORTABLE CHEST - 1 VIEW  COMPARISON:  July 01, 2014  FINDINGS: Lungs are clear. Heart is borderline enlarged with pulmonary vascularity within normal limits. No adenopathy no pneumothorax. No bone lesions.  IMPRESSION: Heart prominent but stable.  No edema or consolidation.   Electronically Signed   By: Bretta Bang III M.D.   On: 04/16/2015 13:29    Scheduled Meds: . aspirin EC  81 mg Oral Daily  . atorvastatin  20 mg Oral q1800  . carvedilol  12.5 mg Oral BID WC  .  enoxaparin (LOVENOX) injection  40 mg Subcutaneous Q24H  . furosemide  80 mg Oral BID  . hydrALAZINE  25 mg Oral q12n4p  . isosorbide dinitrate  20 mg Oral BID  . lisinopril  20 mg Oral Daily  . nitroGLYCERIN  1 inch Topical 3 times per day  . sodium chloride  3 mL Intravenous Q12H   Continuous Infusions:      Time spent: 35 minutes    [facility] A  [facility] Pager [phone] If 7PM-7AM, please contact night-coverage at [email or web address], password [facility] 04/17/2015, 12:18 PM

## 2015-04-17 NOTE — Progress Notes (Signed)
Echocardiogram 2D Echocardiogram has been performed.  Nolon Rod 04/17/2015, 2:02 PM

## 2015-04-18 ENCOUNTER — Encounter (HOSPITAL_COMMUNITY)
Admission: EM | Disposition: A | Discharge status: Other Acute Inpatient Hospital | Payer: Non-veteran care | Source: Home / Self Care | Attending: Emergency Medicine

## 2015-04-18 DIAGNOSIS — I509 Heart failure, unspecified: Secondary | ICD-10-CM | POA: Diagnosis not present

## 2015-04-18 DIAGNOSIS — I4581 Long QT syndrome: Secondary | ICD-10-CM | POA: Diagnosis not present

## 2015-04-18 DIAGNOSIS — I110 Hypertensive heart disease with heart failure: Secondary | ICD-10-CM | POA: Diagnosis not present

## 2015-04-18 DIAGNOSIS — R079 Chest pain, unspecified: Secondary | ICD-10-CM

## 2015-04-18 HISTORY — PX: CARDIAC CATHETERIZATION: SHX172

## 2015-04-18 LAB — BASIC METABOLIC PANEL
Anion gap: 8 (ref 5–15)
BUN: 14 mg/dL (ref 6–20)
CALCIUM: 8.8 mg/dL — AB (ref 8.9–10.3)
CO2: 28 mmol/L (ref 22–32)
CREATININE: 1.22 mg/dL (ref 0.61–1.24)
Chloride: 103 mmol/L (ref 101–111)
GFR calc Af Amer: >60 mL/min (ref >60)
Glucose, Bld: 98 mg/dL (ref 65–99)
Potassium: 4.1 mmol/L (ref 3.5–5.1)
Sodium: 139 mmol/L (ref 135–145)

## 2015-04-18 LAB — MAGNESIUM: Magnesium: 2.1 mg/dL (ref 1.7–2.4)

## 2015-04-18 LAB — CBC
HCT: 42.7 % (ref 39.0–52.0)
Hemoglobin: 14.1 g/dL (ref 13.0–17.0)
MCH: 29.1 pg (ref 26.0–34.0)
MCHC: 33 g/dL (ref 30.0–36.0)
MCV: 88 fL (ref 78.0–100.0)
PLATELETS: 188 10*3/uL (ref 150–400)
RBC: 4.85 MIL/uL (ref 4.22–5.81)
RDW: 14.1 % (ref 11.5–15.5)
WBC: 6.4 10*3/uL (ref 4.0–10.5)

## 2015-04-18 LAB — PROTIME-INR
INR: 1.17 (ref 0.00–1.49)
Prothrombin Time: 15.1 seconds (ref 11.6–15.2)

## 2015-04-18 SURGERY — LEFT HEART CATH AND CORONARY ANGIOGRAPHY
Anesthesia: LOCAL

## 2015-04-18 MED ORDER — HEPARIN SODIUM (PORCINE) 1000 UNIT/ML IJ SOLN
INTRAMUSCULAR | Status: AC
  Filled 2015-04-18: qty 1

## 2015-04-18 MED ORDER — HEPARIN SODIUM (PORCINE) 1000 UNIT/ML IJ SOLN
INTRAMUSCULAR | Status: DC | PRN
  Administered 2015-04-18: 4000 [IU] via INTRAVENOUS

## 2015-04-18 MED ORDER — HEPARIN (PORCINE) IN NACL 2-0.9 UNIT/ML-% IJ SOLN
INTRAMUSCULAR | Status: AC
Start: 2015-04-18 — End: 2015-04-18
  Filled 2015-04-18: qty 1000

## 2015-04-18 MED ORDER — MIDAZOLAM HCL 2 MG/2ML IJ SOLN
INTRAMUSCULAR | Status: AC
  Filled 2015-04-18: qty 2

## 2015-04-18 MED ORDER — SODIUM CHLORIDE 0.9 % IJ SOLN: 3.0000 mL | INTRAMUSCULAR | Status: DC | PRN

## 2015-04-18 MED ORDER — SODIUM CHLORIDE 0.9 % IJ SOLN
INTRAMUSCULAR | Status: DC | PRN
  Administered 2015-04-18: 12:00:00 via INTRA_ARTERIAL

## 2015-04-18 MED ORDER — SODIUM CHLORIDE 0.9 % WEIGHT BASED INFUSION: 3.0000 mL/kg/h | INTRAVENOUS | Status: AC

## 2015-04-18 MED ORDER — FENTANYL CITRATE (PF) 100 MCG/2ML IJ SOLN
INTRAMUSCULAR | Status: AC
  Filled 2015-04-18: qty 2

## 2015-04-18 MED ORDER — IOHEXOL 350 MG/ML SOLN
INTRAVENOUS | Status: DC | PRN
  Administered 2015-04-18: 55 mL via INTRA_ARTERIAL

## 2015-04-18 MED ORDER — VERAPAMIL HCL 2.5 MG/ML IV SOLN
INTRAVENOUS | Status: AC
  Filled 2015-04-18: qty 2

## 2015-04-18 MED ORDER — CARVEDILOL 6.25 MG PO TABS: 12.5000 mg | ORAL_TABLET | Freq: Two times a day (BID) | ORAL | Status: AC

## 2015-04-18 MED ORDER — NITROGLYCERIN 1 MG/10 ML FOR IR/CATH LAB
INTRA_ARTERIAL | Status: AC
  Filled 2015-04-18: qty 10

## 2015-04-18 MED ORDER — LIDOCAINE HCL (PF) 1 % IJ SOLN
INTRAMUSCULAR | Status: AC
  Filled 2015-04-18: qty 30

## 2015-04-18 MED ORDER — FENTANYL CITRATE (PF) 100 MCG/2ML IJ SOLN
INTRAMUSCULAR | Status: DC | PRN
  Administered 2015-04-18: 50 ug via INTRAVENOUS

## 2015-04-18 MED ORDER — SODIUM CHLORIDE 0.9 % IV SOLN: 250.0000 mL | INTRAVENOUS | Status: DC | PRN

## 2015-04-18 MED ORDER — SODIUM CHLORIDE 0.9 % IJ SOLN: 3.0000 mL | Freq: Two times a day (BID) | INTRAMUSCULAR | Status: DC

## 2015-04-18 MED ORDER — MIDAZOLAM HCL 2 MG/2ML IJ SOLN
INTRAMUSCULAR | Status: DC | PRN
  Administered 2015-04-18: 2 mg via INTRAVENOUS

## 2015-04-18 SURGICAL SUPPLY — 10 items
CATH INFINITI 5 FR JL3.5 (CATHETERS) ×2 IMPLANT
CATH INFINITI 5FR ANG PIGTAIL (CATHETERS) ×2 IMPLANT
CATH INFINITI JR4 5F (CATHETERS) ×2 IMPLANT
DEVICE RAD COMP TR BAND LRG (VASCULAR PRODUCTS) ×2 IMPLANT
GLIDESHEATH SLEND SS 6F .021 (SHEATH) ×2 IMPLANT
KIT HEART LEFT (KITS) ×2 IMPLANT
PACK CARDIAC CATHETERIZATION (CUSTOM PROCEDURE TRAY) ×2 IMPLANT
TRANSDUCER W/STOPCOCK (MISCELLANEOUS) ×2 IMPLANT
TUBING CIL FLEX 10 FLL-RA (TUBING) ×2 IMPLANT
WIRE SAFE-T 1.5MM-J .035X260CM (WIRE) ×2 IMPLANT

## 2015-04-18 NOTE — H&P (View-Only) (Signed)
    PROGRESS NOTE  Subjective:   49 yo with hx of mild CAD,  - chronic systolic CHF, admitted with CP , radiation to back , severe HTN  scheduled for cath .  Objective:    Vital Signs:   Temp:  [98.5 F (36.9 C)-98.6 F (37 C)] 98.5 F (36.9 C) (05/12 0552) Pulse Rate:  [80] 80 (05/11 2026) Resp:  [16-20] 16 (05/12 0552) BP: (117-137)/(72-96) 137/96 mmHg (05/12 0552) SpO2:  [99 %-100 %] 100 % (05/12 0552) Weight:  [82.01 kg (180 lb 12.8 oz)] 82.01 kg (180 lb 12.8 oz) (05/12 0552)  Last BM Date: 04/17/15   24-hour weight change: Weight change: -1.905 kg (-4 lb 3.2 oz)  Weight trends: Filed Weights   04/16/15 1230 04/17/15 0521 04/18/15 0552  Weight: 83.915 kg (185 lb) 81.2 kg (179 lb 0.2 oz) 82.01 kg (180 lb 12.8 oz)    Intake/Output:  05/11 0701 - 05/12 0700 In: 240 [P.O.:240] Out: 2201 [Urine:2200; Stool:1]     Physical Exam: BP 137/96 mmHg  Pulse 80  Temp(Src) 98.5 F (36.9 C) (Oral)  Resp 16  Ht 5' 10" (1.778 m)  Wt 82.01 kg (180 lb 12.8 oz)  BMI 25.94 kg/m2  SpO2 100%  Wt Readings from Last 3 Encounters:  04/18/15 82.01 kg (180 lb 12.8 oz)  07/01/14 84.369 kg (186 lb)  04/08/14 83.462 kg (184 lb)    General: Vital signs reviewed and noted.   Head: Normocephalic, atraumatic.  Eyes: conjunctivae/corneas clear.  EOM's intact.   Throat: normal  Neck:  normal   Lungs:    clear   Heart:  normal   Abdomen:  Soft, non-tender, non-distended    Extremities: No edema    Neurologic: A&O X3, CN II - XII are grossly intact.   Psych: Normal     Labs: BMET:  Recent Labs  04/16/15 1245 04/18/15 0340  NA 136 139  K 4.2 4.1  CL 104 103  CO2 23 28  GLUCOSE 89 98  BUN 9 14  CREATININE 1.03 1.22  CALCIUM 8.9 8.8*  MG  --  2.1    Liver function tests: No results for input(s): AST, ALT, ALKPHOS, BILITOT, PROT, ALBUMIN in the last 72 hours. No results for input(s): LIPASE, AMYLASE in the last 72 hours.  CBC:  Recent Labs  04/16/15 1245  04/18/15 0340  WBC 9.3 6.4  HGB 16.1 14.1  HCT 47.2 42.7  MCV 85.5 88.0  PLT 231 188    Cardiac Enzymes:  Recent Labs  04/16/15 1245 04/16/15 2146 04/17/15 0250 04/17/15 0752  TROPONINI 0.03 <0.03 <0.03 <0.03    Coagulation Studies:  Recent Labs  04/18/15 0340  LABPROT 15.1  INR 1.17    Other: Invalid input(s): POCBNP No results for input(s): DDIMER in the last 72 hours. No results for input(s): HGBA1C in the last 72 hours. No results for input(s): CHOL, HDL, LDLCALC, TRIG, CHOLHDL in the last 72 hours. No results for input(s): TSH, T4TOTAL, T3FREE, THYROIDAB in the last 72 hours.  Invalid input(s): FREET3 No results for input(s): VITAMINB12, FOLATE, FERRITIN, TIBC, IRON, RETICCTPCT in the last 72 hours.   Other results:  EKG  ( personally reviewed )  - sinus tach, TWI laterally   Medications:    Infusions: . sodium chloride 1 mL/kg/hr (04/17/15 2207)    Scheduled Medications: . [START ON 04/19/2015] aspirin EC  81 mg Oral Daily  . atorvastatin  20 mg Oral q1800  . carvedilol  12.5   mg Oral BID WC  . enoxaparin (LOVENOX) injection  40 mg Subcutaneous Q24H  . furosemide  80 mg Oral BID  . hydrALAZINE  25 mg Oral q12n4p  . isosorbide dinitrate  20 mg Oral BID  . lisinopril  20 mg Oral Daily  . sodium chloride  3 mL Intravenous Q12H  . sodium chloride  3 mL Intravenous Q12H    Assessment/ Plan:   Active Problems:   Hypertensive heart disease   Chest pain   Pain in the chest   Long QT interval  1. Chronic systolic CHF - for cath today. Continue meds.  2. Chest pressure , with radiation to the back - for cath today.  Had minimal CAD at previous cath last year.   3. Essential HTN:  His BP is normal .  Taking his med.s      Disposition:  Length of Stay:   Mychal Decarlo J. Akio Hudnall, Jr., MD, FACC 04/18/2015, 9:53 AM Office [phone] Pager [phone]    

## 2015-04-18 NOTE — Progress Notes (Signed)
TR Band removed per protocol. Level 0 assessed on right radial wrist; no bleeding noted. Dressing applied. Will continue to monitor.

## 2015-04-18 NOTE — Progress Notes (Signed)
PROGRESS NOTE  Subjective:   49 yo with hx of mild CAD,  - chronic systolic CHF, admitted with CP , radiation to back , severe HTN  scheduled for cath .  Objective:    Vital Signs:   Temp:  [98.5 F (36.9 C)-98.6 F (37 C)] 98.5 F (36.9 C) (05/12 0552) Pulse Rate:  [80] 80 (05/11 2026) Resp:  [16-20] 16 (05/12 0552) BP: (117-137)/(72-96) 137/96 mmHg (05/12 0552) SpO2:  [99 %-100 %] 100 % (05/12 0552) Weight:  [82.01 kg (180 lb 12.8 oz)] 82.01 kg (180 lb 12.8 oz) (05/12 0552)  Last BM Date: 04/17/15   24-hour weight change: Weight change: -1.905 kg (-4 lb 3.2 oz)  Weight trends: Filed Weights   04/16/15 1230 04/17/15 0521 04/18/15 0552  Weight: 83.915 kg (185 lb) 81.2 kg (179 lb 0.2 oz) 82.01 kg (180 lb 12.8 oz)    Intake/Output:  05/11 0701 - 05/12 0700 In: 240 [P.O.:240] Out: 2201 [Urine:2200; Stool:1]     Physical Exam: BP 137/96 mmHg  Pulse 80  Temp(Src) 98.5 F (36.9 C) (Oral)  Resp 16  Ht 5\' 10"  (1.778 m)  Wt 82.01 kg (180 lb 12.8 oz)  BMI 25.94 kg/m2  SpO2 100%  Wt Readings from Last 3 Encounters:  04/18/15 82.01 kg (180 lb 12.8 oz)  07/01/14 84.369 kg (186 lb)  04/08/14 83.462 kg (184 lb)    General: Vital signs reviewed and noted.   Head: Normocephalic, atraumatic.  Eyes: conjunctivae/corneas clear.  EOM's intact.   Throat: normal  Neck:  normal   Lungs:    clear   Heart:  normal   Abdomen:  Soft, non-tender, non-distended    Extremities: No edema    Neurologic: A&O X3, CN II - XII are grossly intact.   Psych: Normal     Labs: BMET:  Recent Labs  04/16/15 1245 04/18/15 0340  NA 136 139  K 4.2 4.1  CL 104 103  CO2 23 28  GLUCOSE 89 98  BUN 9 14  CREATININE 1.03 1.22  CALCIUM 8.9 8.8*  MG  --  2.1    Liver function tests: No results for input(s): AST, ALT, ALKPHOS, BILITOT, PROT, ALBUMIN in the last 72 hours. No results for input(s): LIPASE, AMYLASE in the last 72 hours.  CBC:  Recent Labs  04/16/15 1245  04/18/15 0340  WBC 9.3 6.4  HGB 16.1 14.1  HCT 47.2 42.7  MCV 85.5 88.0  PLT 231 188    Cardiac Enzymes:  Recent Labs  04/16/15 1245 04/16/15 2146 04/17/15 0250 04/17/15 0752  TROPONINI 0.03 <0.03 <0.03 <0.03    Coagulation Studies:  Recent Labs  04/18/15 0340  LABPROT 15.1  INR 1.17    Other: Invalid input(s): POCBNP No results for input(s): DDIMER in the last 72 hours. No results for input(s): HGBA1C in the last 72 hours. No results for input(s): CHOL, HDL, LDLCALC, TRIG, CHOLHDL in the last 72 hours. No results for input(s): TSH, T4TOTAL, T3FREE, THYROIDAB in the last 72 hours.  Invalid input(s): FREET3 No results for input(s): VITAMINB12, FOLATE, FERRITIN, TIBC, IRON, RETICCTPCT in the last 72 hours.   Other results:  EKG  ( personally reviewed )  - sinus tach, TWI laterally   Medications:    Infusions: . sodium chloride 1 mL/kg/hr (04/17/15 2207)    Scheduled Medications: . [START ON 04/19/2015] aspirin EC  81 mg Oral Daily  . atorvastatin  20 mg Oral q1800  . carvedilol  12.5  mg Oral BID WC  . enoxaparin (LOVENOX) injection  40 mg Subcutaneous Q24H  . furosemide  80 mg Oral BID  . hydrALAZINE  25 mg Oral q12n4p  . isosorbide dinitrate  20 mg Oral BID  . lisinopril  20 mg Oral Daily  . sodium chloride  3 mL Intravenous Q12H  . sodium chloride  3 mL Intravenous Q12H    Assessment/ Plan:   Active Problems:   Hypertensive heart disease   Chest pain   Pain in the chest   Long QT interval  1. Chronic systolic CHF - for cath today. Continue meds.  2. Chest pressure , with radiation to the back - for cath today.  Had minimal CAD at previous cath last year.   3. Essential HTN:  His BP is normal .  Taking his med.s      Disposition:  Length of Stay:   Alvia Grove., MD, [facility] 04/18/2015, 9:53 AM Office [phone] Pager [phone]

## 2015-04-18 NOTE — Discharge Summary (Signed)
Physician Discharge Summary  Mark Horton AJO:[id number] DOB: 1966/04/28 DOA: 04/16/2015  PCP: No PCP Per Patient  Admit date: 04/16/2015 Discharge date: 04/18/2015  Time spent: 40 minutes  Recommendations for Outpatient Follow-up:  1. Follow-up with cardiology as outpatient. Need follow-up CT in one year for 4 mm nodule seen on the LUL.  Discharge Diagnoses:  Active Problems:   Hypertensive heart disease   Chest pain   Pain in the chest   Long QT interval   Discharge Condition: Stable  Diet recommendation: Heart healthy  Filed Weights   04/16/15 1230 04/17/15 0521 04/18/15 0552  Weight: 83.915 kg (185 lb) 81.2 kg (179 lb 0.2 oz) 82.01 kg (180 lb 12.8 oz)    History of present illness:  49 yo male with htn, chf (EF 30%), CAD, c/o chest pain.started about 6am this am at rest. sscp "pressure" with radiation to the back. Pt had tingling in his fingers and some diaphoresis. And sob. + cough. Fairly constant. Pt went to [facility] for evaluation, Trop negative. CXR negative. Slg nitro x2 without relief. Pt will be admitted for w/up of chest pain.   Hospital Course:   Chest pain Presented with atypical chest pain, substernal pressure-like but nitroglycerin or rest did not help it. History of dilated cardiomyopathy, nonischemic CAD, LVEF of 30%. Clean coronaries on cath in April 2015. Undergone a stress test on 04/17/2015 with findings consistent with prior MI and multiple defects. Cardiac catheterization was done on 04/18/2015 and showed no evidence of CAD. CT angiography showed no evidence of PE mild coronaries, calcification and LUL 4 mm nodule needs CT in one year. Chest pain could be musculoskeletal.  Hypertension On BiDil, lisinopril and aspirin. Coreg added on discharge.  Chronic CHF/dilated cardiomyopathy. Patient is on Lasix 80 mg by mouth twice a day, no evidence of decompensation continue same dose. Continue BiDil,lisinopril and Coreg.     Procedures:  None  Consultations:  None  Discharge Exam: Filed Vitals:   04/18/15 1545  BP: 119/74  Pulse: 74  Temp:   Resp: 17   General: Alert and awake, oriented x3, not in any acute distress. HEENT: anicteric sclera, pupils reactive to light and accommodation, EOMI CVS: S1-S2 clear, no murmur rubs or gallops Chest: clear to auscultation bilaterally, no wheezing, rales or rhonchi Abdomen: soft nontender, nondistended, normal bowel sounds, no organomegaly Extremities: no cyanosis, clubbing or edema noted bilaterally Neuro: Cranial nerves II-XII intact, no focal neurological deficits  Discharge Instructions   Discharge Instructions    Diet - low sodium heart healthy    Complete by:  As directed      Increase activity slowly    Complete by:  As directed           Current Discharge Medication List    CONTINUE these medications which have CHANGED   Details  carvedilol (COREG) 6.25 MG tablet Take 2 tablets (12.5 mg total) by mouth 2 (two) times daily with a meal. Qty: 60 tablet, Refills: 0      CONTINUE these medications which have NOT CHANGED   Details  albuterol (PROVENTIL HFA;VENTOLIN HFA) 108 (90 BASE) MCG/ACT inhaler Inhale 2 puffs into the lungs every 6 (six) hours as needed for wheezing or shortness of breath.    atorvastatin (LIPITOR) 10 MG tablet Take 20 mg by mouth daily at 6 PM.     furosemide (LASIX) 40 MG tablet Take 2 tablets (80 mg total) by mouth 2 (two) times daily. Qty: 60 tablet, Refills: 12  hydrALAZINE (APRESOLINE) 25 MG tablet Take 37.5 mg by mouth 2 times daily at 12 noon and 4 pm.     lisinopril (PRINIVIL,ZESTRIL) 40 MG tablet Take 30 mg by mouth daily.     omeprazole (PRILOSEC) 20 MG capsule Take 20 mg by mouth daily.    aspirin EC 81 MG EC tablet Take 1 tablet (81 mg total) by mouth daily.    isosorbide-hydrALAZINE (BIDIL) 20-37.5 MG per tablet Take 1 tablet by mouth 2 (two) times daily. Qty: 60 tablet, Refills: 6       STOP taking these medications     isosorbide dinitrate (ISORDIL) 20 MG tablet        No Known Allergies    The results of significant diagnostics from this hospitalization (including imaging, microbiology, ancillary and laboratory) are listed below for reference.    Significant Diagnostic Studies: Ct Angio Chest Pe W/cm &/or Wo Cm  04/17/2015   CLINICAL DATA:  Acute chest pain and shortness of breath.  EXAM: CT ANGIOGRAPHY CHEST WITH CONTRAST  TECHNIQUE: Multidetector CT imaging of the chest was performed using the standard protocol during bolus administration of intravenous contrast. Multiplanar CT image reconstructions and MIPs were obtained to evaluate the vascular anatomy.  CONTRAST:  80mL OMNIPAQUE IOHEXOL 350 MG/ML SOLN  COMPARISON:  Chest radiograph of Apr 16, 2015.  FINDINGS: No pneumothorax or pleural effusion is noted. Blebs are noted in both lung apices. 4 mm nodule is noted in the left upper lobe best seen on image number 39 of series 407. There is no evidence of pulmonary embolus. No mediastinal mass or adenopathy is noted. Thoracic aorta appears grossly normal. Visualized portion of upper abdomen appears normal. Mild coronary artery calcifications are noted. No significant osseous abnormality is noted in the chest.  Review of the MIP images confirms the above findings.  IMPRESSION: No evidence of pulmonary embolus.  Mild coronary artery calcifications are noted suggesting coronary artery disease.  4 mm nodule seen in left upper lobe. If the patient is at high risk for bronchogenic carcinoma, follow-up chest CT at 1 year is recommended. If the patient is at low risk, no follow-up is needed. This recommendation follows the consensus statement: Guidelines for Management of Small Pulmonary Nodules Detected on CT Scans: A Statement from the Fleischner Society as published in Radiology 2005; 237:395-400.   Electronically Signed   By: Lupita Raider, M.D.   On: 04/17/2015 16:07   Nm Myocar  Multi W/spect W/wall Motion / Ef  04/17/2015    Findings consistent with prior myocardial infarction and prior  myocardial infarction with peri-infarct ischemia.  The left ventricular ejection fraction is moderately decreased (30-44%).  This is a high risk study.  Defect 1: There is a small defect of mild severity present in the apical  inferior location.  Defect 2: There is a medium defect of severe severity present in the mid  anterior, mid inferior, apical inferior and apex location.  Defect 3: There is a small defect of mild severity present in the mid  anterior location.   Dg Chest Port 1 View  04/16/2015   CLINICAL DATA:  Chest pain and shortness of breath  EXAM: PORTABLE CHEST - 1 VIEW  COMPARISON:  July 01, 2014  FINDINGS: Lungs are clear. Heart is borderline enlarged with pulmonary vascularity within normal limits. No adenopathy no pneumothorax. No bone lesions.  IMPRESSION: Heart prominent but stable.  No edema or consolidation.   Electronically Signed   By: Bretta Bang III  M.D.   On: 04/16/2015 13:29    Microbiology: Recent Results (from the past 240 hour(s))  MRSA PCR Screening     Status: None   Collection Time: 04/16/15  6:50 PM  Result Value Ref Range Status   MRSA by PCR NEGATIVE NEGATIVE Final    Comment:        The GeneXpert MRSA Assay (FDA approved for NASAL specimens only), is one component of a comprehensive MRSA colonization surveillance program. It is not intended to diagnose MRSA infection nor to guide or monitor treatment for MRSA infections.      Labs: Basic Metabolic Panel:  Recent Labs Lab 04/16/15 1245 04/18/15 0340  NA 136 139  K 4.2 4.1  CL 104 103  CO2 23 28  GLUCOSE 89 98  BUN 9 14  CREATININE 1.03 1.22  CALCIUM 8.9 8.8*  MG  --  2.1   Liver Function Tests: No results for input(s): AST, ALT, ALKPHOS, BILITOT, PROT, ALBUMIN in the last 168 hours. No results for input(s): LIPASE, AMYLASE in the last 168 hours. No results for  input(s): AMMONIA in the last 168 hours. CBC:  Recent Labs Lab 04/16/15 1245 04/18/15 0340  WBC 9.3 6.4  HGB 16.1 14.1  HCT 47.2 42.7  MCV 85.5 88.0  PLT 231 188   Cardiac Enzymes:  Recent Labs Lab 04/16/15 1245 04/16/15 2146 04/17/15 0250 04/17/15 0752  TROPONINI 0.03 <0.03 <0.03 <0.03   BNP: BNP (last 3 results)  Recent Labs  04/16/15 1245  BNP 689.5*    ProBNP (last 3 results)  Recent Labs  07/01/14 1935  PROBNP 2393.0*    CBG: No results for input(s): GLUCAP in the last 168 hours.     Signed:  Srihith Aquilino A  [facility] 04/18/2015, 4:37 PM

## 2015-04-18 NOTE — Interval H&P Note (Signed)
History and Physical Interval Note:  04/18/2015 11:55 AM  Mark Horton  has presented today for cardiac cath with the diagnosis of chest pain/unstable angina/positive nuclear stress test  The various methods of treatment have been discussed with the patient and family. After consideration of risks, benefits and other options for treatment, the patient has consented to  Procedure(s): Left Heart Cath and Coronary Angiography (N/A) as a surgical intervention .  The patient's history has been reviewed, patient examined, no change in status, stable for surgery.  I have reviewed the patient's chart and labs.  Questions were answered to the patient's satisfaction.    Cath Lab Visit (complete for each Cath Lab visit)  Clinical Evaluation Leading to the Procedure:   ACS: No.  Non-ACS:    Anginal Classification: CCS III  Anti-ischemic medical therapy: Minimal Therapy (1 class of medications)  Non-Invasive Test Results: High-risk stress test findings: cardiac mortality >3%/year  Prior CABG: No previous CABG        Mark Horton

## 2015-04-19 ENCOUNTER — Encounter (HOSPITAL_COMMUNITY): Payer: Self-pay | Admitting: Cardiovascular Disease

## 2015-04-19 MED FILL — Heparin Sodium (Porcine) 2 Unit/ML in Sodium Chloride 0.9%: INTRAMUSCULAR | Qty: 1000 | Status: AC

## 2015-04-19 MED FILL — Lidocaine HCl Local Preservative Free (PF) Inj 1%: INTRAMUSCULAR | Qty: 30 | Status: AC

## 2015-05-02 IMAGING — CR DG CHEST 2V
2 series · 2 of 2 positions shown · non-contrast
Comparison: 04/04/2014.

CLINICAL DATA: Chest pain and shortness of Breath.

EXAM:
CHEST  2 VIEW

[w chest pa]
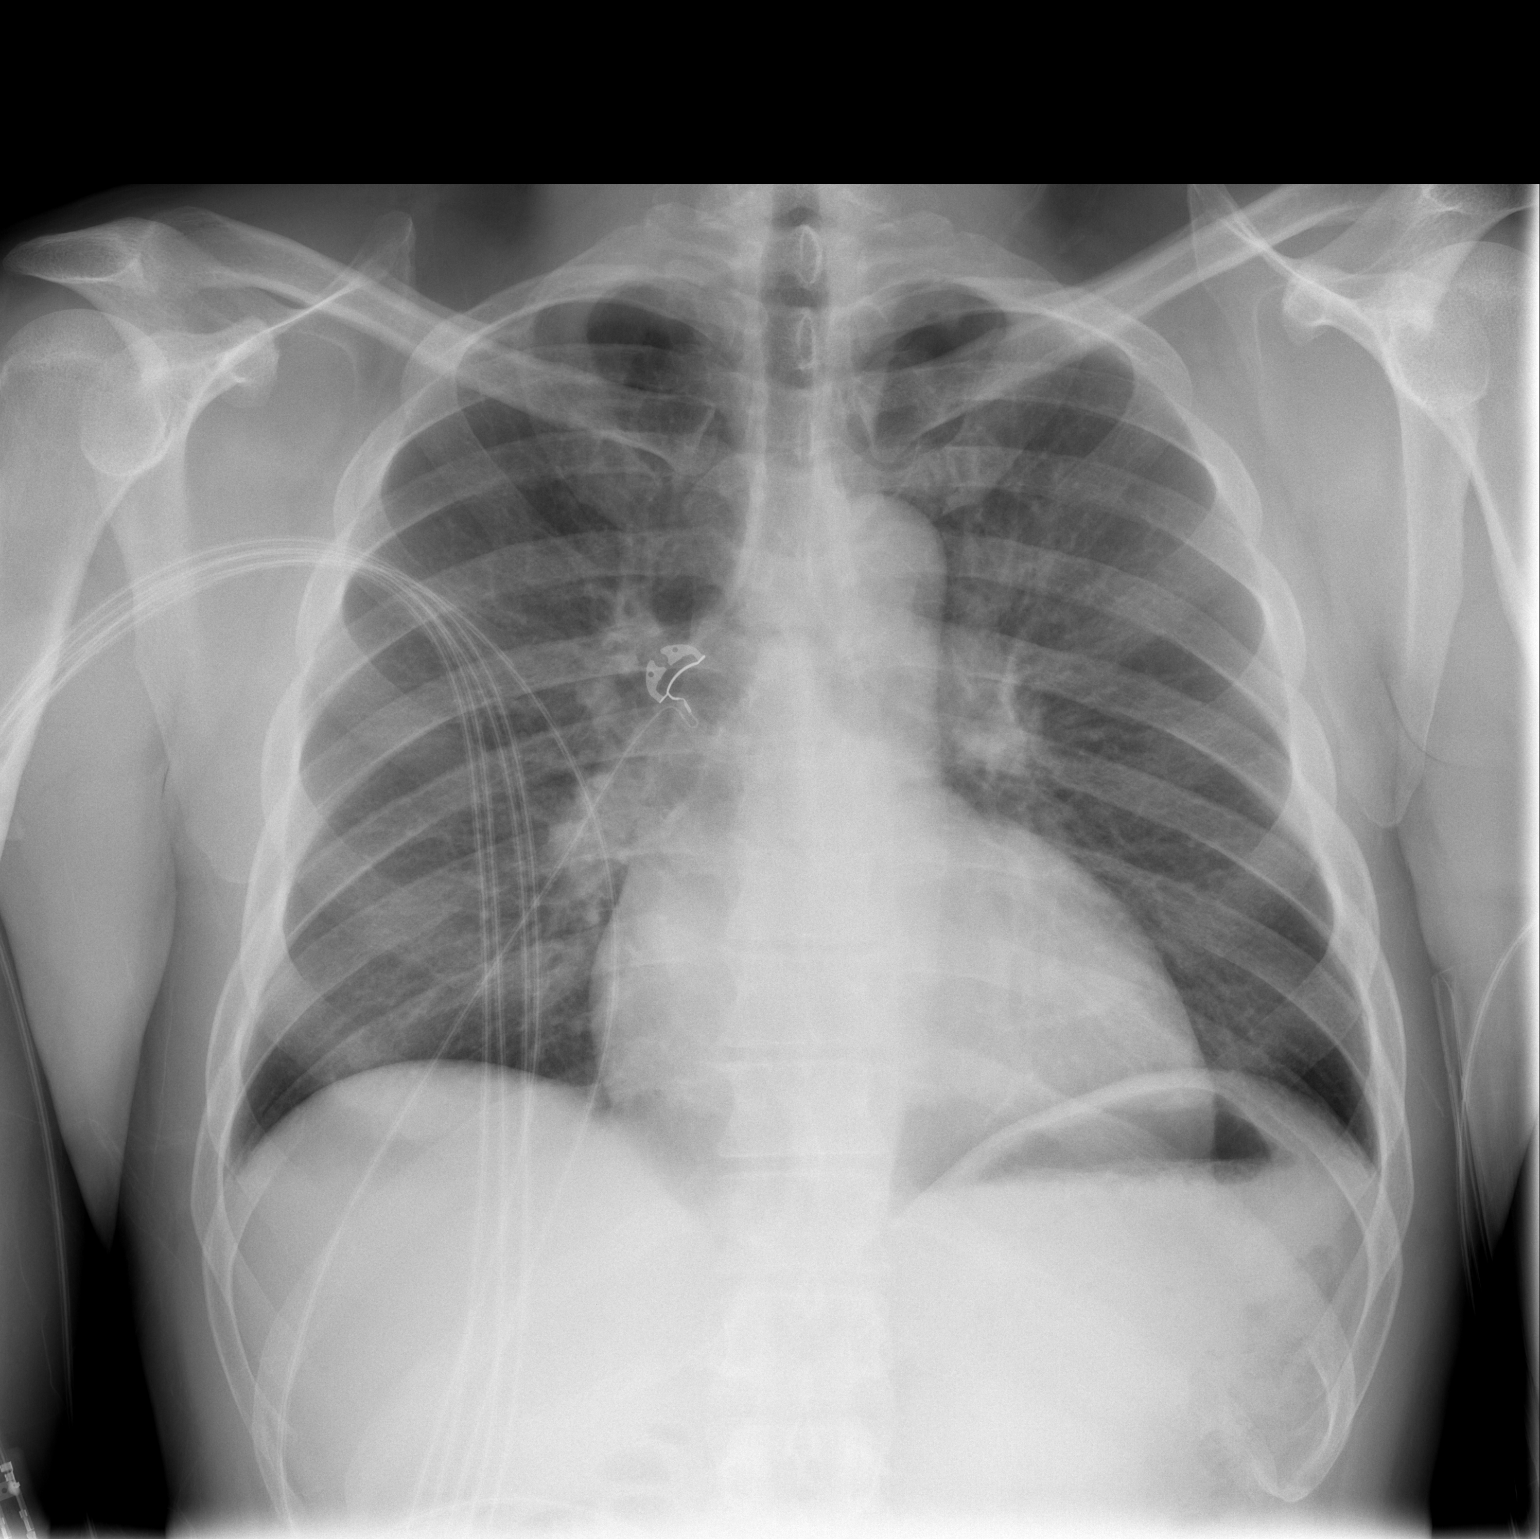

[w chest lat]
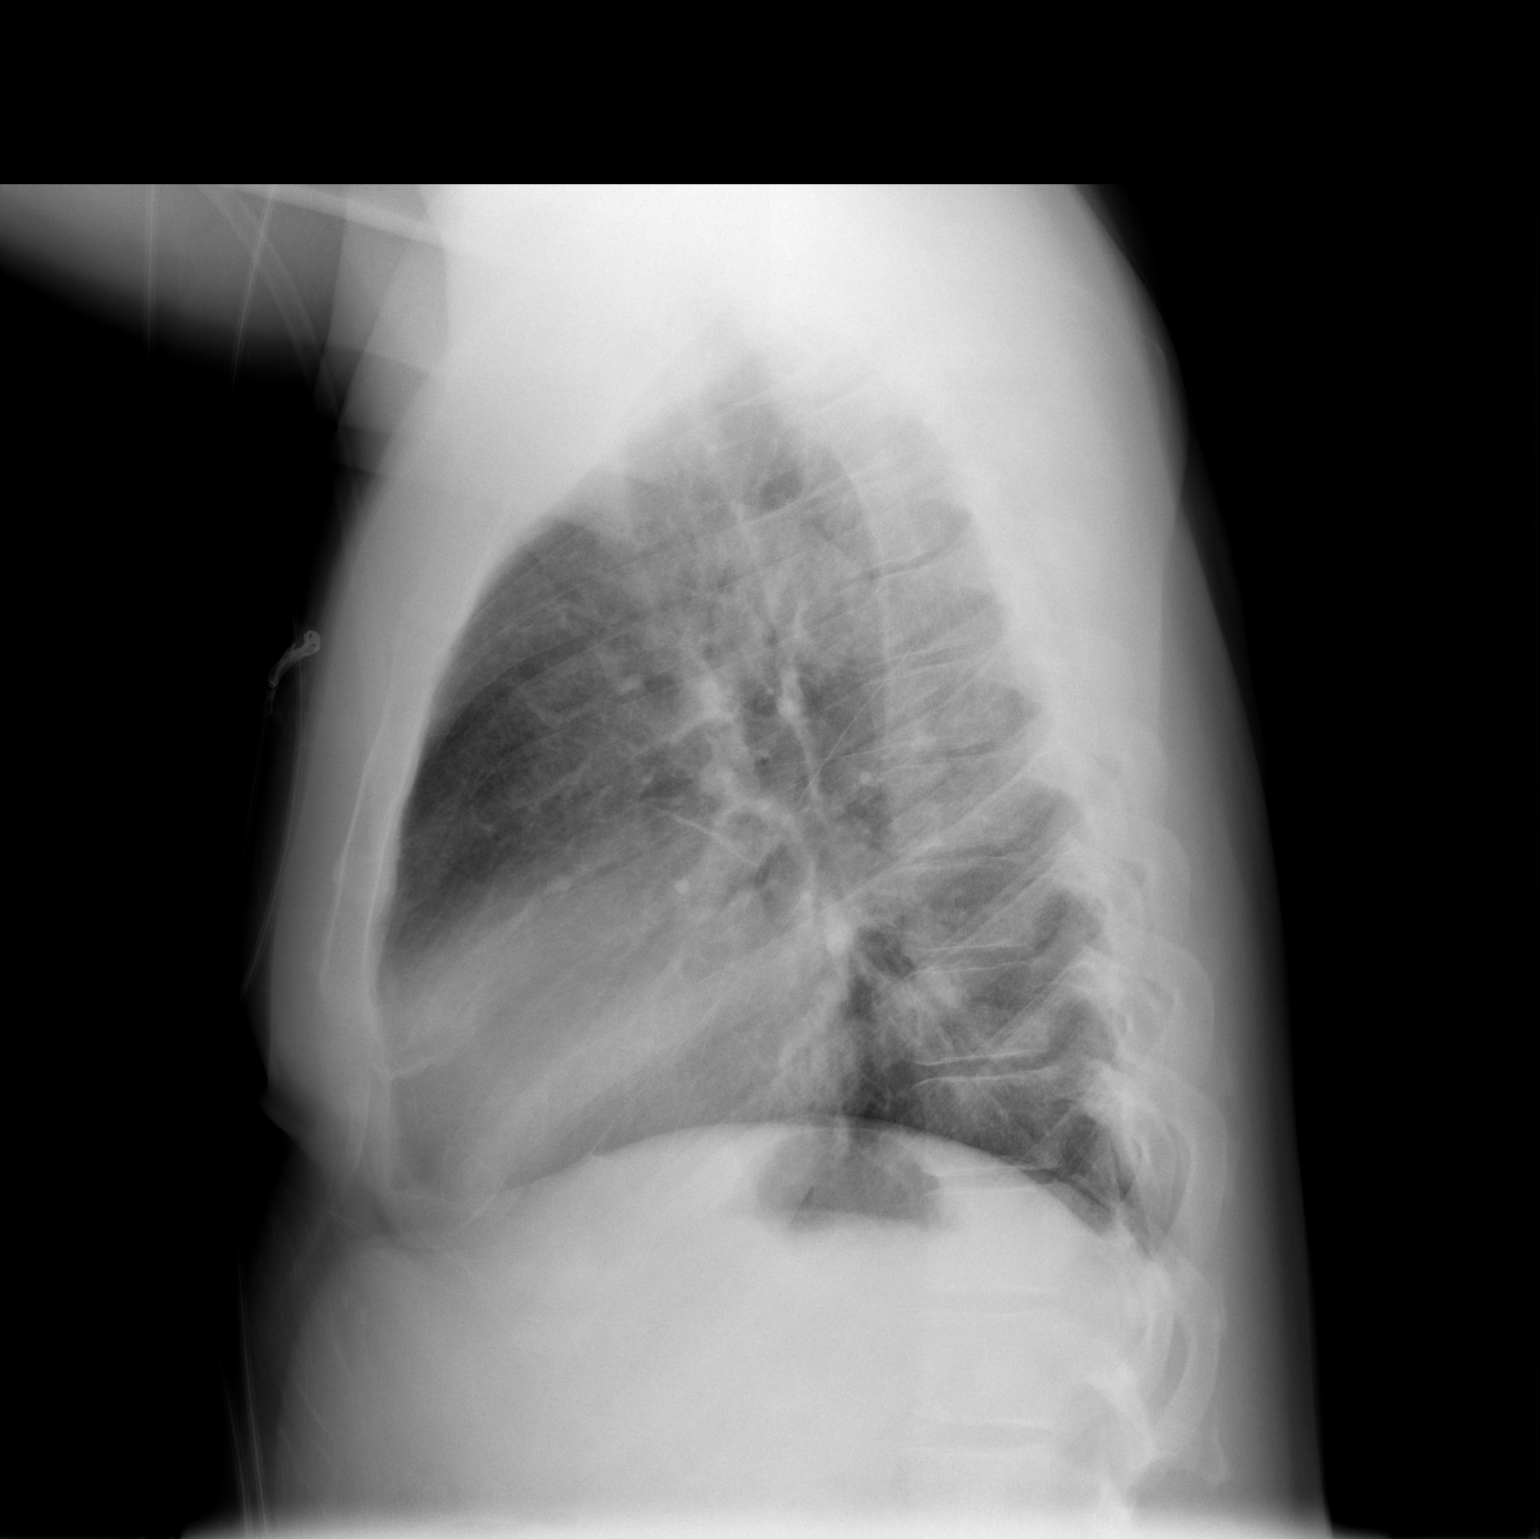

[2 of 2 positions shown; findings below may reference images not displayed]

FINDINGS: The cardiac silhouette, mediastinal and hilar contours are upper
limits normal. The lungs are clear. No pleural effusion. The bony
thorax is intact.
IMPRESSION: No acute cardiopulmonary findings. Complete resolution of the
pneumonia seen on the prior study.

## 2015-06-20 ENCOUNTER — Emergency Department (HOSPITAL_BASED_OUTPATIENT_CLINIC_OR_DEPARTMENT_OTHER): Payer: Non-veteran care

## 2015-06-20 ENCOUNTER — Observation Stay (HOSPITAL_BASED_OUTPATIENT_CLINIC_OR_DEPARTMENT_OTHER)
Admission: EM | Admit: 2015-06-20 | Discharge: 2015-06-21 | Disposition: A | Discharge status: Home / Self Care | Payer: Non-veteran care | Attending: Internal Medicine | Admitting: Internal Medicine

## 2015-06-20 ENCOUNTER — Encounter (HOSPITAL_BASED_OUTPATIENT_CLINIC_OR_DEPARTMENT_OTHER): Payer: Self-pay | Admitting: *Deleted

## 2015-06-20 DIAGNOSIS — R071 Chest pain on breathing: Secondary | ICD-10-CM

## 2015-06-20 DIAGNOSIS — I208 Other forms of angina pectoris: Principal | ICD-10-CM | POA: Insufficient documentation

## 2015-06-20 DIAGNOSIS — I5040 Unspecified combined systolic (congestive) and diastolic (congestive) heart failure: Secondary | ICD-10-CM | POA: Diagnosis not present

## 2015-06-20 DIAGNOSIS — I428 Other cardiomyopathies: Secondary | ICD-10-CM

## 2015-06-20 DIAGNOSIS — J209 Acute bronchitis, unspecified: Secondary | ICD-10-CM

## 2015-06-20 DIAGNOSIS — I10 Essential (primary) hypertension: Secondary | ICD-10-CM | POA: Diagnosis not present

## 2015-06-20 DIAGNOSIS — R079 Chest pain, unspecified: Secondary | ICD-10-CM | POA: Diagnosis present

## 2015-06-20 DIAGNOSIS — Z720 Tobacco use: Secondary | ICD-10-CM | POA: Diagnosis present

## 2015-06-20 DIAGNOSIS — I2089 Other forms of angina pectoris: Secondary | ICD-10-CM | POA: Diagnosis present

## 2015-06-20 DIAGNOSIS — M549 Dorsalgia, unspecified: Secondary | ICD-10-CM

## 2015-06-20 LAB — CBC
HCT: 43.2 % (ref 39.0–52.0)
HCT: 44.2 % (ref 39.0–52.0)
Hemoglobin: 14.4 g/dL (ref 13.0–17.0)
Hemoglobin: 14.8 g/dL (ref 13.0–17.0)
MCH: 29.1 pg (ref 26.0–34.0)
MCH: 29.1 pg (ref 26.0–34.0)
MCHC: 33.3 g/dL (ref 30.0–36.0)
MCHC: 33.5 g/dL (ref 30.0–36.0)
MCV: 87.3 fL (ref 78.0–100.0)
MCV: 87 fL (ref 78.0–100.0)
PLATELETS: 210 10*3/uL (ref 150–400)
Platelets: 208 10*3/uL (ref 150–400)
RBC: 4.95 MIL/uL (ref 4.22–5.81)
RBC: 5.08 MIL/uL (ref 4.22–5.81)
RDW: 13.2 % (ref 11.5–15.5)
RDW: 13.4 % (ref 11.5–15.5)
WBC: 8.5 10*3/uL (ref 4.0–10.5)
WBC: 7.8 10*3/uL (ref 4.0–10.5)

## 2015-06-20 LAB — TROPONIN I
Troponin I: <0.03 ng/mL (ref <0.031)
Troponin I: <0.03 ng/mL (ref <0.031)

## 2015-06-20 LAB — CREATININE, SERUM: CREATININE: 1.14 mg/dL (ref 0.61–1.24)

## 2015-06-20 LAB — BASIC METABOLIC PANEL
Anion gap: 8 (ref 5–15)
BUN: 14 mg/dL (ref 6–20)
CALCIUM: 8.6 mg/dL — AB (ref 8.9–10.3)
CO2: 26 mmol/L (ref 22–32)
CREATININE: 1.23 mg/dL (ref 0.61–1.24)
Chloride: 105 mmol/L (ref 101–111)
GFR calc Af Amer: >60 mL/min (ref >60)
GFR calc non Af Amer: >60 mL/min (ref >60)
GLUCOSE: 102 mg/dL — AB (ref 65–99)
Potassium: 3.9 mmol/L (ref 3.5–5.1)
SODIUM: 139 mmol/L (ref 135–145)

## 2015-06-20 LAB — BRAIN NATRIURETIC PEPTIDE: B Natriuretic Peptide: 366.7 pg/mL — ABNORMAL HIGH (ref 0.0–100.0)

## 2015-06-20 MED ORDER — LISINOPRIL 20 MG PO TABS
30.0000 mg | ORAL_TABLET | Freq: Every day | ORAL | Status: DC
  Administered 2015-06-20 – 2015-06-21 (×2): 30 mg via ORAL
  Filled 2015-06-20 (×4): qty 1

## 2015-06-20 MED ORDER — MORPHINE SULFATE 4 MG/ML IJ SOLN
4.0000 mg | INTRAMUSCULAR | Status: DC | PRN
  Administered 2015-06-20: 4 mg via INTRAVENOUS
  Filled 2015-06-20: qty 1

## 2015-06-20 MED ORDER — PANTOPRAZOLE SODIUM 40 MG PO TBEC
40.0000 mg | DELAYED_RELEASE_TABLET | Freq: Every day | ORAL | Status: DC
  Administered 2015-06-21: 40 mg via ORAL
  Filled 2015-06-20: qty 1

## 2015-06-20 MED ORDER — ISOSORB DINITRATE-HYDRALAZINE 20-37.5 MG PO TABS
1.0000 | ORAL_TABLET | Freq: Two times a day (BID) | ORAL | Status: DC
  Administered 2015-06-20 – 2015-06-21 (×2): 1 via ORAL
  Filled 2015-06-20 (×2): qty 1

## 2015-06-20 MED ORDER — ONDANSETRON HCL 4 MG/2ML IJ SOLN
4.0000 mg | Freq: Four times a day (QID) | INTRAMUSCULAR | Status: DC | PRN
  Administered 2015-06-21: 4 mg via INTRAVENOUS
  Filled 2015-06-20: qty 2

## 2015-06-20 MED ORDER — FUROSEMIDE 80 MG PO TABS
80.0000 mg | ORAL_TABLET | Freq: Two times a day (BID) | ORAL | Status: DC
  Administered 2015-06-20 – 2015-06-21 (×2): 80 mg via ORAL
  Filled 2015-06-20 (×2): qty 1

## 2015-06-20 MED ORDER — ASPIRIN EC 325 MG PO TBEC
325.0000 mg | DELAYED_RELEASE_TABLET | Freq: Every day | ORAL | Status: DC
  Administered 2015-06-20 – 2015-06-21 (×2): 325 mg via ORAL
  Filled 2015-06-20 (×2): qty 1

## 2015-06-20 MED ORDER — DOXYCYCLINE HYCLATE 100 MG PO TABS
100.0000 mg | ORAL_TABLET | Freq: Two times a day (BID) | ORAL | Status: DC
  Administered 2015-06-20 – 2015-06-21 (×2): 100 mg via ORAL
  Filled 2015-06-20 (×2): qty 1

## 2015-06-20 MED ORDER — CARVEDILOL 12.5 MG PO TABS
12.5000 mg | ORAL_TABLET | Freq: Two times a day (BID) | ORAL | Status: DC
  Administered 2015-06-20 – 2015-06-21 (×2): 12.5 mg via ORAL
  Filled 2015-06-20 (×2): qty 1

## 2015-06-20 MED ORDER — ENOXAPARIN SODIUM 40 MG/0.4ML ~~LOC~~ SOLN
40.0000 mg | SUBCUTANEOUS | Status: DC
  Administered 2015-06-20: 40 mg via SUBCUTANEOUS
  Filled 2015-06-20: qty 0.4

## 2015-06-20 MED ORDER — IOHEXOL 350 MG/ML SOLN
100.0000 mL | Freq: Once | INTRAVENOUS | Status: AC | PRN
  Administered 2015-06-20: 100 mL via INTRAVENOUS

## 2015-06-20 MED ORDER — ATORVASTATIN CALCIUM 20 MG PO TABS
20.0000 mg | ORAL_TABLET | Freq: Every day | ORAL | Status: DC
  Administered 2015-06-20: 20 mg via ORAL
  Filled 2015-06-20: qty 1

## 2015-06-20 MED ORDER — ONDANSETRON 4 MG PO TBDP
4.0000 mg | ORAL_TABLET | Freq: Once | ORAL | Status: AC
  Administered 2015-06-20: 4 mg via ORAL

## 2015-06-20 MED ORDER — IBUPROFEN 600 MG PO TABS
600.0000 mg | ORAL_TABLET | Freq: Four times a day (QID) | ORAL | Status: DC
  Administered 2015-06-20 (×2): 600 mg via ORAL
  Filled 2015-06-20 (×3): qty 1

## 2015-06-20 MED ORDER — ALBUTEROL SULFATE (2.5 MG/3ML) 0.083% IN NEBU: 3.0000 mL | INHALATION_SOLUTION | Freq: Four times a day (QID) | RESPIRATORY_TRACT | Status: DC | PRN

## 2015-06-20 MED ORDER — KETOROLAC TROMETHAMINE 30 MG/ML IJ SOLN
30.0000 mg | Freq: Once | INTRAMUSCULAR | Status: AC
Start: 2015-06-20 — End: 2015-06-20
  Administered 2015-06-20: 30 mg via INTRAVENOUS
  Filled 2015-06-20: qty 1

## 2015-06-20 MED ORDER — ONDANSETRON HCL 4 MG/2ML IJ SOLN
4.0000 mg | Freq: Once | INTRAMUSCULAR | Status: AC
  Administered 2015-06-20: 4 mg via INTRAVENOUS
  Filled 2015-06-20: qty 2

## 2015-06-20 MED ORDER — ONDANSETRON 4 MG PO TBDP
ORAL_TABLET | ORAL | Status: AC
  Filled 2015-06-20: qty 1

## 2015-06-20 MED ORDER — KETOROLAC TROMETHAMINE 30 MG/ML IJ SOLN
30.0000 mg | Freq: Once | INTRAMUSCULAR | Status: AC
  Administered 2015-06-20: 30 mg via INTRAVENOUS
  Filled 2015-06-20: qty 1

## 2015-06-20 MED ORDER — METHYLPREDNISOLONE SODIUM SUCC 125 MG IJ SOLR
60.0000 mg | Freq: Four times a day (QID) | INTRAMUSCULAR | Status: DC
  Administered 2015-06-20 – 2015-06-21 (×3): 60 mg via INTRAVENOUS
  Filled 2015-06-20 (×3): qty 2

## 2015-06-20 MED ORDER — ACETAMINOPHEN 325 MG PO TABS: 650.0000 mg | ORAL_TABLET | ORAL | Status: DC | PRN

## 2015-06-20 MED ORDER — ONDANSETRON HCL 4 MG/2ML IJ SOLN: 4.0000 mg | Freq: Once | INTRAMUSCULAR | Status: DC

## 2015-06-20 NOTE — ED Notes (Signed)
MD at bedside. 

## 2015-06-20 NOTE — Progress Notes (Signed)
Was called by Dr. Jaquita Rector resident working with Dr. Gwendolyn Grant admits and [place] to [place] the patient with chest pain and chest heaviness with diaphoresis and back pain with some pleuritic component. Patient with history of dilated nonischemic cardiomyopathy last EF of 30-35% history of hypertension. Patient with recent cardiac catheterization in May which was negative per resident. Due to patient's presentation of chest pain which was somewhat atypical [facility] last 2 admit the patient for further evaluation and management. Resident stated they spoke with Dr. Mayford Knife of cardiology who had also recommended admission for observation overnight for chest pain rule out. EKG with no significant new ischemic changes. Cardiac enzymes negative 2. CT angio of the chest was negative for PE. Patient accepted to telemetry bed at [facility].

## 2015-06-20 NOTE — ED Notes (Signed)
Reports 10 days of symptoms, worse x 4 days- Hx of CHF- c/o chest pressure, cough, and "sweats"- states pain is worse in back, under right scapula- painful with cough

## 2015-06-20 NOTE — ED Provider Notes (Signed)
CSN: [id number]     Arrival date & time 06/20/15  4098 History   First MD Initiated Contact with Patient 06/20/15 [phone]     Chief Complaint  Patient presents with  . Chest Pain     (Consider location/radiation/quality/duration/timing/severity/associated sxs/prior Treatment) HPI Comments: Pt. Is a 49 y/o M here with chest discomfort, heaviness, cough, and pressure. He has a history of CHF with reduced EF of 30-35% on Lasix 80mg  BID. He was recently admitted to the hospital in 5/16 for chest pain rule out and had a cath at that time that was negative. Today, he says he started having cough and primarily right back pain behind his scapula 10 days ago. It has progressively gotten worse and acutely worse over the past 4 days. He continues to have cough, he complains of chest heaviness, but no frank pain. He has had diaphoresis at home, but no fever. He has not had any sick contacts, but was in the hospital in may of this year. He denies nausea, vomiting, arm pain, neck pain, jaw pain. He has not had vision changes. No abdominal pain. His back pain / chest pain is worse with movement and with deep inspiration / coughing. It is also worse when twisting to the right. It is constant and not relieved by rest. He has continued to take all of his medications at home as prescribed. He has not taken any more fluid pills than prescribed, and he has not had any lower extremity swelling. He denies weight gain. He otherwise denies ETOH use.   The history is provided by the patient.    Past Medical History  Diagnosis Date  . Hypertension   . Complication of anesthesia     It does not last long enough " I wake up I feel everything "  . Chronic systolic CHF (congestive heart failure)     a. 03/2013 Echo: EF 30%, diff HK, worse in inf/infsept/apical walls, PASP  . NICM (nonischemic cardiomyopathy)     a. 03/2013 Echo: EF 30%.  Marland Kitchen CAD (coronary artery disease) Non-obstructive    a. 03/2014 Cath: Elevated R heart  pressures, LM nl, LAD 40p, D1/2 nl, LCX nl, RCA nl.  . H/O hiatal hernia   . GERD (gastroesophageal reflux disease)    Past Surgical History  Procedure Laterality Date  . Cholecystectomy    . Left knee surgery    . Coronary angiogram  03/29/14    NL cors  . Left and right heart catheterization with coronary angiogram N/A 03/29/2014    Procedure: LEFT AND RIGHT HEART CATHETERIZATION WITH CORONARY ANGIOGRAM;  Surgeon: Micheline Chapman, MD;  Location: [facility] CATH LAB;  Service: Cardiovascular;  Laterality: N/A;  . Cardiac catheterization N/A 04/18/2015    Procedure: Left Heart Cath and Coronary Angiography;  Surgeon: Kathleene Hazel, MD;  Location: [facility] INVASIVE CV LAB;  Service: Cardiovascular;  Laterality: N/A;   Family History  Problem Relation Age of Onset  . Heart disease Father     Stent and CHF  . Hypertension Mother   . Diabetes Mother   . Fibromyalgia Mother    History  Substance Use Topics  . Smoking status: Current Some Day Smoker -- 0.50 packs/day for 35 years    Types: Cigarettes  . Smokeless tobacco: Never Used     Comment: Pt has not smoked since the monday before last.  . Alcohol Use: No    Review of Systems  Constitutional: Positive for diaphoresis and fatigue. Negative for  fever, chills, activity change, appetite change and unexpected weight change.  HENT: Negative.  Negative for congestion, facial swelling, postnasal drip, sinus pressure and sore throat.   Eyes: Negative.  Negative for pain and visual disturbance.  Respiratory: Positive for cough, chest tightness and shortness of breath. Negative for wheezing and stridor.   Cardiovascular: Positive for chest pain. Negative for palpitations and leg swelling.  Gastrointestinal: Negative for nausea, vomiting, abdominal pain, diarrhea, constipation and abdominal distention.  Endocrine: Negative.  Negative for polydipsia and polyuria.  Genitourinary: Negative for dysuria, urgency and frequency.  Musculoskeletal:  Positive for back pain. Negative for myalgias, joint swelling, arthralgias, neck pain and neck stiffness.  Skin: Negative for pallor and rash.  Allergic/Immunologic: Negative.   Neurological: Negative.  Negative for dizziness, weakness and light-headedness.  Hematological: Negative.   Psychiatric/Behavioral: Negative.       Allergies  Review of patient's allergies indicates no known allergies.  Home Medications   Prior to Admission medications   Medication Sig Start Date End Date Taking? Authorizing Provider  albuterol (PROVENTIL HFA;VENTOLIN HFA) 108 (90 BASE) MCG/ACT inhaler Inhale 2 puffs into the lungs every 6 (six) hours as needed for wheezing or shortness of breath.   Yes Historical Provider, MD  aspirin EC 81 MG EC tablet Take 1 tablet (81 mg total) by mouth daily. 03/31/14  Yes Ok Anis, NP  atorvastatin (LIPITOR) 10 MG tablet Take 20 mg by mouth daily at 6 PM.  03/31/14  Yes Ok Anis, NP  carvedilol (COREG) 6.25 MG tablet Take 2 tablets (12.5 mg total) by mouth 2 (two) times daily with a meal. 04/18/15  Yes Clydia Llano, MD  furosemide (LASIX) 40 MG tablet Take 2 tablets (80 mg total) by mouth 2 (two) times daily. 04/04/14  Yes Lewayne Bunting, MD  hydrALAZINE (APRESOLINE) 25 MG tablet Take 37.5 mg by mouth 2 times daily at 12 noon and 4 pm.    Yes Historical Provider, MD  isosorbide-hydrALAZINE (BIDIL) 20-37.5 MG per tablet Take 1 tablet by mouth 2 (two) times daily. 03/31/14  Yes Ok Anis, NP  lisinopril (PRINIVIL,ZESTRIL) 40 MG tablet Take 30 mg by mouth daily.  03/31/14  Yes Ok Anis, NP  omeprazole (PRILOSEC) 20 MG capsule Take 20 mg by mouth daily.   Yes Historical Provider, MD   BP 146/94 mmHg  Pulse 94  Temp(Src) 98.9 F (37.2 C) (Oral)  Resp 20  Ht  (1.778 m)  Wt 185 lb (83.915 kg)  BMI 26.54 kg/m2  SpO2 97% Physical Exam  Constitutional: He is oriented to person, place, and time. He appears well-developed and  well-nourished.  Uncomfortable Appearing   HENT:  Head: Normocephalic and atraumatic.  Nose: Nose normal.  Mouth/Throat: Oropharynx is clear and moist.  Eyes: Conjunctivae and EOM are normal. Pupils are equal, round, and reactive to light.  Neck: Normal range of motion. Neck supple. No JVD present. No thyromegaly present.  Cardiovascular: Normal rate, regular rhythm, S1 normal, S2 normal, intact distal pulses and normal pulses.  PMI is not displaced.  Exam reveals no gallop, no friction rub and no decreased pulses.   No murmur heard. Pulmonary/Chest: Effort normal. No accessory muscle usage. No tachypnea. No respiratory distress. He has no decreased breath sounds. He has no wheezes. He has rhonchi in the right lower field. He has no rales. He exhibits tenderness. He exhibits no mass, no crepitus and no deformity.  Abdominal: Soft. Bowel sounds are normal. He exhibits no distension. There  is no tenderness. There is no rebound and no guarding.  Musculoskeletal: He exhibits tenderness. He exhibits no edema.       Arms: Lymphadenopathy:    He has no cervical adenopathy.  Neurological: He is alert and oriented to person, place, and time. No cranial nerve deficit. He exhibits normal muscle tone. Coordination normal.  Skin: Skin is warm. He is diaphoretic. No erythema.  Psychiatric: He has a normal mood and affect. His behavior is normal.  Vitals reviewed.   ED Course  Procedures (including critical care time) Labs Review Labs Reviewed  BASIC METABOLIC PANEL - Abnormal; Notable for the following:    Glucose, Bld 102 (*)    Calcium 8.6 (*)    All other components within normal limits  BRAIN NATRIURETIC PEPTIDE - Abnormal; Notable for the following:    B Natriuretic Peptide 366.7 (*)    All other components within normal limits  CBC  TROPONIN I  TROPONIN I    Imaging Review Dg Chest 2 View  06/20/2015   CLINICAL DATA:  Shortness of breath, cough  EXAM: CHEST  2 VIEW  COMPARISON:  None.   FINDINGS: Mild bilateral interstitial thickening.  There is no focal parenchymal opacity. There is no pleural effusion or pneumothorax. The heart and mediastinal contours are unremarkable.  The osseous structures are unremarkable.  IMPRESSION: Mild bilateral interstitial thickening as can be seen with bronchitis versus atypical infection versus mild interstitial edema.   Electronically Signed   By: Elige Ko   On: 06/20/2015 10:40   Ct Angio Chest Pe W/cm &/or Wo Cm  06/20/2015   CLINICAL DATA:  Back pain and shortness of breath for 1 week  EXAM: CT ANGIOGRAPHY CHEST WITH CONTRAST  TECHNIQUE: Multidetector CT imaging of the chest was performed using the standard protocol during bolus administration of intravenous contrast. Multiplanar CT image reconstructions and MIPs were obtained to evaluate the vascular anatomy.  CONTRAST:  OMNIPAQUE IOHEXOL 350 MG/ML SOLN  COMPARISON:  Chest x-ray from earlier in the same day. The lungs are well aerated bilaterally. Mild emphysematous changes are noted in the apices. No focal confluent infiltrate is seen. Mild bronchial thickening is noted likely related to a degree of bronchitis.  The thoracic inlet is within normal limits. The pulmonary artery is well visualized and within normal limits without filling defect to suggest pulmonary embolism. Mild bilateral hilar lymph nodes are noted likely of reactive nature. These are most prominent in the right infrahilar region. Small mediastinal lymph nodes are noted. The largest of these mediastinal lymph nodes is noted in the article pulmonary window best seen on image number 102 of series 5. This measures approximately 12 mm in short axis. No right heart strain is noted. Coronary calcifications are seen.  The thoracic aorta demonstrates mild atherosclerotic calcifications without aneurysmal dilatation. Scanning into the upper abdomen reveals no acute abnormality. The osseous structures are grossly unremarkable.  FINDINGS:  Mild emphysematous changes.  Peribronchial thickening and likely reactive hilar lymph nodes consistent with bronchitis. No focal confluent infiltrate is seen.  No evidence of pulmonary embolism.  No other focal abnormality is noted.  Review of the MIP images confirms the above findings.   Electronically Signed   By: Alcide Clever M.D.   On: 06/20/2015 12:37     EKG Interpretation   Date/Time:  Thursday June 20 2015 09:56:48 EDT Ventricular Rate:  89 PR Interval:  162 QRS Duration: 100 QT Interval:  404 QTC Calculation: 491 R Axis:   -  40 Text Interpretation:  Normal sinus rhythm Possible Left atrial enlargement  Left axis deviation Left ventricular hypertrophy ST \\T \ T wave  abnormality, consider inferolateral ischemia Prolonged QT No significant  change since last tracing Confirmed by Gwendolyn Grant  MD, BLAIR ([id number]) on  06/20/2015 10:02:01 AM      MDM   Final diagnoses:  Chest pain on breathing    Pt. Is a 49 y/o M with hx of Nonischemic cardiomyopathy with reduced EF of 30%, HTN, Here with chest pain / discomfort, and diaphoresis. He has worse pain with deep inspiration. CTA without evidence of PE. Troponins negative x 2. EKG without acute changes. Workup predominantly negative, but he continues to have discomfort and diaphoresis. HEART score 4-5. Discussed with cardiology who feel that this is low likelihood to be ACS, but not unreasonable to admit for observation. Discussed with [facility] Dr. Janee Morn. Will admit for ACS rule out.     Yolande Jolly, MD 06/20/15 1335  Elwin Mocha, MD 06/20/15 [phone]

## 2015-06-20 NOTE — H&P (Signed)
[facility] History and Physical  Mark Horton ZOX:[id number] DOB: February 10, 1966 DOA: 06/20/2015  Referring physician: [facility] PCP: No PCP Per Patient  Specialists: Cardiolopgy  Chief Complaint: chest pain  HPI:  49 year old ? 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, chronic smoker in the past,prior heavy EtOH Admission 5/10-->5/12 2016 chest pain rule out been previously admitted for healthcare associated pneumonia April 2015  10 d h/o SOB And 3 d h/o CP and pain in the lower back since ~ 06/16/15 SOB seems to come and go On am of admit 7.14--had some Sb and "couldn;t move" Pain was behind his R shoulder pain-chest pain worsened by bending and turning-[place]ler pain on R is the main c.o today tylebnol jhelped the pain 3-4 days ago   Does carpentry was  No heavy lifting or anything recenrtly Coughing and deep breathing makes the pain worse No sickness recently  Smoking 4-5 cigs a day stilll  nop platypnoea no doe Can walk ~30 feet and gets winded 2 mo agio could walk without issue      Review of Systems: The patient denies  diarr n V Prior h/o pancreatitis and and has had GB surgery in the past-this was [place] in [place] -dizzy now but was light headed this am +diaphoresis Trying to quit smoking   Past Medical History  Diagnosis Date  . Hypertension   . Complication of anesthesia     It does not last long enough " I wake up I feel everything "  . Chronic systolic CHF (congestive heart failure)     a. 03/2013 Echo: EF 30%, diff HK, worse in inf/infsept/apical walls, PASP  . NICM (nonischemic cardiomyopathy)     a. 03/2013 Echo: EF 30%.  Marland Kitchen CAD (coronary artery disease) Non-obstructive    a. 03/2014 Cath: Elevated R heart pressures, LM nl, LAD 40p, D1/2 nl, LCX nl, RCA nl.  . H/O hiatal hernia   . GERD (gastroesophageal reflux disease)     Past Surgical History  Procedure Laterality Date  . Cholecystectomy    . Left knee surgery    . Coronary angiogram  03/29/14    NL cors  . Left and right heart catheterization with coronary angiogram N/A 03/29/2014    Procedure: LEFT AND RIGHT HEART CATHETERIZATION WITH CORONARY ANGIOGRAM;  Surgeon: Micheline Chapman, MD;  Location: [facility] CATH LAB;  Service: Cardiovascular;  Laterality: N/A;  . Cardiac catheterization N/A 04/18/2015    Procedure: Left Heart Cath and Coronary Angiography;  Surgeon: Kathleene Hazel, MD;  Location: [facility] INVASIVE CV LAB;  Service: Cardiovascular;  Laterality: N/A;   Social History:  History   Social History Narrative    No Known Allergies  Family History  Problem Relation Age of Onset  . Heart disease Father     Stent and CHF  . Hypertension Mother   . Diabetes Mother   . Fibromyalgia Mother     Prior to Admission medications   Medication Sig Start Date End Date Taking? Authorizing Provider  albuterol (PROVENTIL HFA;VENTOLIN HFA) 108 (90 BASE) MCG/ACT inhaler Inhale 2 puffs into the lungs every 6 (six) hours as needed for wheezing or shortness of breath.   Yes Historical Provider, MD  aspirin EC 81 MG EC tablet Take 1 tablet (81 mg total) by mouth daily. 03/31/14  Yes Ok Anis, NP  atorvastatin (LIPITOR) 10 MG tablet Take 20 mg by mouth daily at 6 PM.  03/31/14  Yes Ok Anis, NP  carvedilol (COREG) 6.25 MG tablet Take 2 tablets (12.5 mg total) by mouth 2 (two) times daily with a meal. 04/18/15  Yes Clydia Llano, MD  furosemide (LASIX) 40 MG tablet Take 2 tablets (80 mg total) by mouth 2 (two) times daily. 04/04/14  Yes Lewayne Bunting, MD  hydrALAZINE (APRESOLINE) 25 MG tablet Take 37.5 mg by mouth 2 times daily at 12 noon and 4 pm.    Yes Historical Provider, MD  isosorbide-hydrALAZINE (BIDIL) 20-37.5 MG per tablet Take 1 tablet by mouth 2 (two) times daily. 03/31/14  Yes Ok Anis, NP  lisinopril (PRINIVIL,ZESTRIL) 40  MG tablet Take 30 mg by mouth daily.  03/31/14  Yes Ok Anis, NP  omeprazole (PRILOSEC) 20 MG capsule Take 20 mg by mouth daily.   Yes Historical Provider, MD   Physical Exam: Filed Vitals:   06/20/15 1300 06/20/15 1400 06/20/15 1430 06/20/15 1551  BP: 142/100 150/106 152/99 157/98  Pulse: 78 84 75 81  Temp:    98.5 F (36.9 C)  TempSrc:    Oral  Resp: 14 16 20 18   Height:    5\' 10"  (1.778 m)  Weight:    84.913 kg (187 lb 3.2 oz)  SpO2: 98% 98% 99% 100%    EOMI NCAT pleasant Visibly in distress moving upper extremities and when coughing however otherwise is pleasant Not able to appreciate any specific wheeze however some coarse crackles S1-S2 no murmur rub or gallop however elevated JVD about 3-4 cm no bruit Abdomen soft nontender nondistended no rebound No lower extremity edema Pain is reproducible and anterior chest across precordium he has some epigastric tenderness and he mainly has pain in his posterior scapula Neuromuscular exam is within normal limits  Labs on Admission:  Basic Metabolic Panel:  Recent Labs Lab 06/20/15 1025  NA 139  K 3.9  CL 105  CO2 26  GLUCOSE 102*  BUN 14  CREATININE 1.23  CALCIUM 8.6*   Liver Function Tests: No results for input(s): AST, ALT, ALKPHOS, BILITOT, PROT, ALBUMIN in the last 168 hours. No results for input(s): LIPASE, AMYLASE in the last 168 hours. No results for input(s): AMMONIA in the last 168 hours. CBC:  Recent Labs Lab 06/20/15 1025  WBC 8.5  HGB 14.4  HCT 43.2  MCV 87.3  PLT 210   Cardiac Enzymes:  Recent Labs Lab 06/20/15 1025 06/20/15 1220  TROPONINI <0.03 <0.03    BNP (last 3 results)  Recent Labs  04/16/15 1245 06/20/15 1025  BNP 689.5* 366.7*    ProBNP (last 3 results)  Recent Labs  07/01/14 1935  PROBNP 2393.0*    CBG: No results for input(s): GLUCAP in the last 168 hours.  Radiological Exams on Admission: Dg Chest 2 View  06/20/2015   CLINICAL DATA:  Shortness of  breath, cough  EXAM: CHEST  2 VIEW  COMPARISON:  None.  FINDINGS: Mild bilateral interstitial thickening.  There is no focal parenchymal opacity. There is no pleural effusion or pneumothorax. The heart and mediastinal contours are unremarkable.  The osseous structures are unremarkable.  IMPRESSION: Mild bilateral interstitial thickening as can be seen with bronchitis versus atypical infection versus mild interstitial edema.   Electronically Signed   By: Elige Ko   On: 06/20/2015 10:40   Ct Angio Chest Pe W/cm &/or Wo Cm  06/20/2015   CLINICAL DATA:  Back pain and shortness of breath for 1 week  EXAM: CT ANGIOGRAPHY CHEST WITH CONTRAST  TECHNIQUE: Multidetector CT imaging of the chest was performed using the standard protocol during bolus administration of intravenous contrast. Multiplanar CT image reconstructions and MIPs were obtained to evaluate the vascular anatomy.  CONTRAST:  OMNIPAQUE IOHEXOL 350 MG/ML SOLN  COMPARISON:  Chest x-ray from earlier in the same day. The lungs are well aerated bilaterally. Mild emphysematous changes are noted in the apices. No focal confluent infiltrate is seen. Mild bronchial thickening is noted likely related to a degree of bronchitis.  The thoracic inlet is within normal limits. The pulmonary artery is well visualized and within normal limits without filling defect to suggest pulmonary embolism. Mild bilateral hilar lymph nodes are noted likely of reactive nature. These are most prominent in the right infrahilar region. Small mediastinal lymph nodes are noted. The largest of these mediastinal lymph nodes is noted in the article pulmonary window best seen on image number 102 of series 5. This measures approximately 12 mm in short axis. No right heart strain is noted. Coronary calcifications are seen.  The thoracic aorta demonstrates mild atherosclerotic calcifications without aneurysmal dilatation. Scanning into the upper abdomen reveals no acute abnormality. The  osseous structures are grossly unremarkable.  FINDINGS: Mild emphysematous changes.  Peribronchial thickening and likely reactive hilar lymph nodes consistent with bronchitis. No focal confluent infiltrate is seen.  No evidence of pulmonary embolism.  No other focal abnormality is noted.  Review of the MIP images confirms the above findings.   Electronically Signed   By: Alcide Clever M.D.   On: 06/20/2015 12:37    EKG: Independently reviewed. Sinus rhythm with LVFB and LBBB. T wave inversions consistent with previous EKG findings.   Assessment/Plan Principal Problem:   Angina of effort Active Problems:   Tobacco abuse   Non-ischemic cardiomyopathy- cath 03/29/14   Essential hypertension   Acute bronchitis   Combined congestive systolic and diastolic heart failure   Back pain, acute   1. Angina of effort - Central chest pain that is worse with leaning over--no typical findings on EKG of pericarditis i.e. tachycardia - CXR negative for pneumonia - CT angiogram chest negative for PE - EKG: sinus rhythm with LAFB and LBBB. T wave inversions consistent with previous EKG - Previous cardiac cath revealed 40% blockage in LAD on 5/16 - not necessary to repeat cath at this time? Defer to cardiology -  Sublingual nitro PRN for discomfort  - Will cycle troponin levels and continue to monitor - Monitor on telemetry  2. Acute bronchitis in a setting of advanced COPD/emphysema - Chronic cough with new onset sputum production  -Add by mouth doxycycline based on ISD A guidelines -Will add IV steroids Solu-Medrol temporarily and hopefully this can be transitioned and 24. Prednisone  3. Essential HTN - Continue regular medications - Carvidolol, lisinopril, isosorbide-hydralazine  4. Back pain - Pain behind right scapula that is reproducible, worse with movement and worse with deep inspiration - Most likely musculoskeletal, possibly due to increased force of coughing from bronchitis - Nsaids  (ibuprofen) for pain control and inflammation  5. Probable mildly decompensated CHF with nonischemic cardiomyopathy and recent echocardiogram -Hx of systolic and diastolic heart failure - Increased SOB. Over past few months has noticed increased DOE, no extremity edema -On exam has elevated JVD so might have some pulmonary arterial hypertension secondary to smoking - Continue medications (careviedolol, lisinopril)    6. Non-ischemic cardiomyopathy EF 35 to 35% + grade 2 diastolic dysfunction - Monitor  -Diuresis possible with Lasix by mouth for now  -  ACE relatively contraindicated so monitor labs a.m. carefully  7. Tobacco abuse - Has cut down to 5-6 cigarettes per day - Encourage cessation   8. Chronic kidney disease stage 2 -Stable at this time   Code Status: Full Family Communication: Patient Disposition Plan: Observation, likely d/c AM  Time spent: 45  Mahala Menghini [facility] Pager [phone]  If 7PM-7AM, please contact night-coverage [email or web address] Password [facility] 06/20/2015, 4:12 PM

## 2015-06-21 DIAGNOSIS — I208 Other forms of angina pectoris: Secondary | ICD-10-CM | POA: Diagnosis not present

## 2015-06-21 DIAGNOSIS — J209 Acute bronchitis, unspecified: Secondary | ICD-10-CM

## 2015-06-21 DIAGNOSIS — I5040 Unspecified combined systolic (congestive) and diastolic (congestive) heart failure: Secondary | ICD-10-CM | POA: Diagnosis not present

## 2015-06-21 DIAGNOSIS — I5043 Acute on chronic combined systolic (congestive) and diastolic (congestive) heart failure: Secondary | ICD-10-CM

## 2015-06-21 DIAGNOSIS — I10 Essential (primary) hypertension: Secondary | ICD-10-CM | POA: Diagnosis not present

## 2015-06-21 DIAGNOSIS — M549 Dorsalgia, unspecified: Secondary | ICD-10-CM

## 2015-06-21 DIAGNOSIS — Z720 Tobacco use: Secondary | ICD-10-CM

## 2015-06-21 DIAGNOSIS — I429 Cardiomyopathy, unspecified: Secondary | ICD-10-CM

## 2015-06-21 LAB — TROPONIN I: Troponin I: <0.03 ng/mL (ref <0.031)

## 2015-06-21 MED ORDER — TRAMADOL HCL 50 MG PO TABS
50.0000 mg | ORAL_TABLET | Freq: Four times a day (QID) | ORAL | Status: DC | PRN
  Administered 2015-06-21: 50 mg via ORAL
  Filled 2015-06-21: qty 1

## 2015-06-21 MED ORDER — PROMETHAZINE HCL 25 MG/ML IJ SOLN
12.5000 mg | Freq: Once | INTRAMUSCULAR | Status: AC
  Administered 2015-06-21: 12.5 mg via INTRAVENOUS
  Filled 2015-06-21: qty 1

## 2015-06-21 MED ORDER — PREDNISONE 10 MG PO TABS: ORAL_TABLET | ORAL | Status: AC

## 2015-06-21 MED ORDER — TRAMADOL HCL 50 MG PO TABS: 50.0000 mg | ORAL_TABLET | Freq: Four times a day (QID) | ORAL | Status: AC | PRN

## 2015-06-21 MED ORDER — DOXYCYCLINE HYCLATE 100 MG PO TABS: 100.0000 mg | ORAL_TABLET | Freq: Two times a day (BID) | ORAL | Status: AC

## 2015-06-21 NOTE — Consult Note (Signed)
CONSULTATION NOTE  Reason for Consult: Chest pain, DOE, "I feel much better this morning"  Requesting Physician: Dr. Tana Coast  Cardiologist: Dr. Stanford Breed  HPI: This is a 49 y.o. male with a past medical history significant for non-ischemic cardiomyopathy and LVEF 30%. He had minor CAD at cath 1 year ago (40% LAD) and was recently admitted in 04/2015. Repeat cardiac catheterization at that time showed "no angiographic evidence of CAD".  He again presents with shortness of breath and 3 days of chest pressure radiating to his back and right shoulder, worse with bending and turning, increasing productive cough, symptoms not helped by SL NTG.  ECG demonstrates LAFB and LBBB, with T wave inversions that are unchanged from previous. Troponin negative x 3. BNP is elevated at 366. Cardiology is asked to consult and assist with management.  PMHx:  Past Medical History  Diagnosis Date  . Hypertension   . Complication of anesthesia     It does not last long enough " I wake up I feel everything "  . Chronic systolic CHF (congestive heart failure)     a. 03/2013 Echo: EF 30%, diff HK, worse in inf/infsept/apical walls, PASP 33mHg  . NICM (nonischemic cardiomyopathy)     a. 03/2013 Echo: EF 30%.  .Marland KitchenCAD (coronary artery disease) Non-obstructive    a. 03/2014 Cath: Elevated R heart pressures, LM nl, LAD 40p, D1/2 nl, LCX nl, RCA nl.  . H/O hiatal hernia   . GERD (gastroesophageal reflux disease)    Past Surgical History  Procedure Laterality Date  . Cholecystectomy    . Left knee surgery    . Coronary angiogram  03/29/14    NL cors  . Left and right heart catheterization with coronary angiogram N/A 03/29/2014    Procedure: LEFT AND RIGHT HEART CATHETERIZATION WITH CORONARY ANGIOGRAM;  Surgeon: MBlane Ohara MD;  [facility];  Service: Cardiovascular;  Laterality: N/A;  . Cardiac catheterization N/A 04/18/2015    Procedure: Left Heart Cath and Coronary Angiography;  Surgeon: CBurnell Blanks MD;  [facility];  Service: Cardiovascular;  Laterality: N/A;    FAMHx: Family History  Problem Relation Age of Onset  . Heart disease Father     Stent and CHF  . Hypertension Mother   . Diabetes Mother   . Fibromyalgia Mother     SOCHx:  reports that he has been smoking Cigarettes.  He has a 17.5 pack-year smoking history. He has never used smokeless tobacco. He reports that he does not drink alcohol or use illicit drugs.  ALLERGIES: No Known Allergies  ROS: A comprehensive review of systems was negative except for: Respiratory: positive for cough, dyspnea on exertion and sputum Cardiovascular: positive for chest pain  HOME MEDICATIONS: Prescriptions prior to admission  Medication Sig Dispense Refill Last Dose  . albuterol (PROVENTIL HFA;VENTOLIN HFA) 108 (90 BASE) MCG/ACT inhaler Inhale 2 puffs into the lungs every 6 (six) hours as needed for wheezing or shortness of breath.   04/12/2015  . aspirin EC 81 MG EC tablet Take 1 tablet (81 mg total) by mouth daily.   Not Taking at Unknown time  . atorvastatin (LIPITOR) 10 MG tablet Take 20 mg by mouth daily at 6 PM.    04/14/2015 at Unknown time  . carvedilol (COREG) 6.25 MG tablet Take 2 tablets (12.5 mg total) by mouth 2 (two) times daily with a meal. 60 tablet 0   . furosemide (LASIX) 40 MG tablet Take  2 tablets (80 mg total) by mouth 2 (two) times daily. 60 tablet 12 04/15/2015 at Unknown time  . hydrALAZINE (APRESOLINE) 25 MG tablet Take 37.5 mg by mouth 2 times daily at 12 noon and 4 pm.    04/15/2015 at Unknown time  . isosorbide-hydrALAZINE (BIDIL) 20-37.5 MG per tablet Take 1 tablet by mouth 2 (two) times daily. 60 tablet 6 Not Taking at Unknown time  . lisinopril (PRINIVIL,ZESTRIL) 40 MG tablet Take 30 mg by mouth daily.    04/15/2015 at Unknown time  . omeprazole (PRILOSEC) 20 MG capsule Take 20 mg by mouth daily.   04/15/2015 at Unknown time    HOSPITAL MEDICATIONS: I have reviewed the patient's current  medications.  VITALS: Blood pressure 151/100, pulse 80, temperature 97.6 F (36.4 C), temperature source Oral, resp. rate 20, height _0  (1.778 m), weight 185 lb 1.6 oz (83.961 kg), SpO2 100 %.  PHYSICAL EXAM: General appearance: alert and no distress Neck: no carotid bruit and no JVD Lungs: clear to auscultation bilaterally Heart: regular rate and rhythm, S1, S2 normal, no murmur, click, rub or gallop Abdomen: soft, non-tender; bowel sounds normal; no masses,  no organomegaly Extremities: extremities normal, atraumatic, no cyanosis or edema Pulses: 2+ and symmetric Skin: Skin color, texture, turgor normal. No rashes or lesions Neurologic: Grossly normal Psych: Pleasant  LABS: Results for orders placed or performed during the hospital encounter of 06/20/15 (from the past 48 hour(s))  Basic metabolic panel     Status: Abnormal   Collection Time: 06/20/15 10:25 AM  Result Value Ref Range   Sodium 139 135 - 145 mmol/L   Potassium 3.9 3.5 - 5.1 mmol/L   Chloride 105 101 - 111 mmol/L   CO2 26 22 - 32 mmol/L   Glucose, Bld 102 (H) 65 - 99 mg/dL   BUN 14 6 - 20 mg/dL   Creatinine, Ser 1.23 0.61 - 1.24 mg/dL   Calcium 8.6 (L) 8.9 - 10.3 mg/dL   GFR calc non Af Amer >60 >60 mL/min   GFR calc Af Amer >60 >60 mL/min    Comment: (NOTE) The eGFR has been calculated using the CKD EPI equation. This calculation has not been validated in all clinical situations. eGFR's persistently <60 mL/min signify possible Chronic Kidney Disease.    Anion gap 8 5 - 15  CBC     Status: None   Collection Time: 06/20/15 10:25 AM  Result Value Ref Range   WBC 8.5 4.0 - 10.5 K/uL   RBC 4.95 4.22 - 5.81 MIL/uL   Hemoglobin 14.4 13.0 - 17.0 g/dL   HCT 43.2 39.0 - 52.0 %   MCV 87.3 78.0 - 100.0 fL   MCH 29.1 26.0 - 34.0 pg   MCHC 33.3 30.0 - 36.0 g/dL   RDW 13.2 11.5 - 15.5 %   Platelets 210 150 - 400 K/uL  Troponin I     Status: None   Collection Time: 06/20/15 10:25 AM  Result Value Ref Range    Troponin I <0.03 <0.031 ng/mL    Comment:        NO INDICATION OF MYOCARDIAL INJURY.   Brain natriuretic peptide     Status: Abnormal   Collection Time: 06/20/15 10:25 AM  Result Value Ref Range   B Natriuretic Peptide 366.7 (H) 0.0 - 100.0 pg/mL  Troponin I     Status: None   Collection Time: 06/20/15 12:20 PM  Result Value Ref Range   Troponin I <0.03 <0.031 ng/mL  Comment:        NO INDICATION OF MYOCARDIAL INJURY.   Troponin I-serum (0, 3, 6 hours)     Status: None   Collection Time: 06/20/15  6:19 PM  Result Value Ref Range   Troponin I <0.03 <0.031 ng/mL    Comment:        NO INDICATION OF MYOCARDIAL INJURY.   CBC     Status: None   Collection Time: 06/20/15  6:19 PM  Result Value Ref Range   WBC 7.8 4.0 - 10.5 K/uL   RBC 5.08 4.22 - 5.81 MIL/uL   Hemoglobin 14.8 13.0 - 17.0 g/dL   HCT 44.2 39.0 - 52.0 %   MCV 87.0 78.0 - 100.0 fL   MCH 29.1 26.0 - 34.0 pg   MCHC 33.5 30.0 - 36.0 g/dL   RDW 13.4 11.5 - 15.5 %   Platelets 208 150 - 400 K/uL  Creatinine, serum     Status: None   Collection Time: 06/20/15  6:19 PM  Result Value Ref Range   Creatinine, Ser 1.14 0.61 - 1.24 mg/dL   GFR calc non Af Amer >60 >60 mL/min   GFR calc Af Amer >60 >60 mL/min    Comment: (NOTE) The eGFR has been calculated using the CKD EPI equation. This calculation has not been validated in all clinical situations. eGFR's persistently <60 mL/min signify possible Chronic Kidney Disease.   Troponin I-serum (0, 3, 6 hours)     Status: None   Collection Time: 06/20/15  9:44 PM  Result Value Ref Range   Troponin I <0.03 <0.031 ng/mL    Comment:        NO INDICATION OF MYOCARDIAL INJURY.   Troponin I     Status: None   Collection Time: 06/21/15 12:49 AM  Result Value Ref Range   Troponin I <0.03 <0.031 ng/mL    Comment:        NO INDICATION OF MYOCARDIAL INJURY.     IMAGING: Dg Chest 2 View  06/20/2015   CLINICAL DATA:  Shortness of breath, cough  EXAM: CHEST  2 VIEW   COMPARISON:  None.  FINDINGS: Mild bilateral interstitial thickening.  There is no focal parenchymal opacity. There is no pleural effusion or pneumothorax. The heart and mediastinal contours are unremarkable.  The osseous structures are unremarkable.  IMPRESSION: Mild bilateral interstitial thickening as can be seen with bronchitis versus atypical infection versus mild interstitial edema.   Electronically Signed   By: Kathreen Devoid   On: 06/20/2015 10:40   Ct Angio Chest Pe W/cm &/or Wo Cm  06/20/2015   CLINICAL DATA:  Back pain and shortness of breath for 1 week  EXAM: CT ANGIOGRAPHY CHEST WITH CONTRAST  TECHNIQUE: Multidetector CT imaging of the chest was performed using the standard protocol during bolus administration of intravenous contrast. Multiplanar CT image reconstructions and MIPs were obtained to evaluate the vascular anatomy.  CONTRAST:  126m OMNIPAQUE IOHEXOL 350 MG/ML SOLN  COMPARISON:  Chest x-ray from earlier in the same day. The lungs are well aerated bilaterally. Mild emphysematous changes are noted in the apices. No focal confluent infiltrate is seen. Mild bronchial thickening is noted likely related to a degree of bronchitis.  The thoracic inlet is within normal limits. The pulmonary artery is well visualized and within normal limits without filling defect to suggest pulmonary embolism. Mild bilateral hilar lymph nodes are noted likely of reactive nature. These are most prominent in the right infrahilar region. Small mediastinal lymph  nodes are noted. The largest of these mediastinal lymph nodes is noted in the article pulmonary window best seen on image number 102 of series 5. This measures approximately 12 mm in short axis. No right heart strain is noted. Coronary calcifications are seen.  The thoracic aorta demonstrates mild atherosclerotic calcifications without aneurysmal dilatation. Scanning into the upper abdomen reveals no acute abnormality. The osseous structures are grossly  unremarkable.  FINDINGS: Mild emphysematous changes.  Peribronchial thickening and likely reactive hilar lymph nodes consistent with bronchitis. No focal confluent infiltrate is seen.  No evidence of pulmonary embolism.  No other focal abnormality is noted.  Review of the MIP images confirms the above findings.   Electronically Signed   By: Inez Catalina M.D.   On: 06/20/2015 12:37    HOSPITAL DIAGNOSES: Principal Problem:   Angina of effort Active Problems:   Tobacco abuse   Non-ischemic cardiomyopathy- cath 03/29/14   Essential hypertension   Acute bronchitis   Combined congestive systolic and diastolic heart failure   Back pain, acute   Angina effort   IMPRESSION: 1. Mild acute decompensated systolic congestive heart failure 2. Probable acute bronchitis  RECOMMENDATION: 1. Appears euvolemic on exam today, lungs clear, no edema - on home dose lasix. Cough has improved, chest pain has resolved. Recent cath 2 months ago without angiographic CAD. Troponin negative x 3. Would continue current heart failure medications. Probably could be discharged today. Will need follow-up with Dr. Stanford Breed - he has been non-compliant with follow-up since his heart recent heart cath.  Thanks for the consult. Call with questions.  Time Spent Directly with Patient: 30 minutes  Pixie Casino, MD, [facility] Attending Cardiologist [place] 06/21/2015, 8:10 AM

## 2015-06-21 NOTE — Discharge Instructions (Addendum)
Acute Bronchitis Bronchitis is inflammation of the airways that extend from the windpipe into the lungs (bronchi). The inflammation often causes mucus to develop. This leads to a cough, which is the most common symptom of bronchitis.  In acute bronchitis, the condition usually develops suddenly and goes away over time, usually in a couple weeks. Smoking, allergies, and asthma can make bronchitis worse. Repeated episodes of bronchitis may cause further lung problems.  CAUSES Acute bronchitis is most often caused by the same virus that causes a cold. The virus can spread from person to person (contagious) through coughing, sneezing, and touching contaminated objects. SIGNS AND SYMPTOMS   Cough.   Fever.   Coughing up mucus.   Body aches.   Chest congestion.   Chills.   Shortness of breath.   Sore throat.  DIAGNOSIS  Acute bronchitis is usually diagnosed through a physical exam. Your health care provider will also ask you questions about your medical history. Tests, such as chest X-rays, are sometimes done to rule out other conditions.  TREATMENT  Acute bronchitis usually goes away in a couple weeks. Oftentimes, no medical treatment is necessary. Medicines are sometimes given for relief of fever or cough. Antibiotic medicines are usually not needed but may be prescribed in certain situations. In some cases, an inhaler may be recommended to help reduce shortness of breath and control the cough. A cool mist vaporizer may also be used to help thin bronchial secretions and make it easier to clear the chest.  HOME CARE INSTRUCTIONS  Get plenty of rest.   Drink enough fluids to keep your urine clear or pale yellow (unless you have a medical condition that requires fluid restriction). Increasing fluids may help thin your respiratory secretions (sputum) and reduce chest congestion, and it will prevent dehydration.   Take medicines only as directed by your health care  provider.  If you were prescribed an antibiotic medicine, finish it all even if you start to feel better.  Avoid smoking and secondhand smoke. Exposure to cigarette smoke or irritating chemicals will make bronchitis worse. If you are a smoker, consider using nicotine gum or skin patches to help control withdrawal symptoms. Quitting smoking will help your lungs heal faster.   Reduce the chances of another bout of acute bronchitis by washing your hands frequently, avoiding people with cold symptoms, and trying not to touch your hands to your mouth, nose, or eyes.   Keep all follow-up visits as directed by your health care provider.  SEEK MEDICAL CARE IF: Your symptoms do not improve after 1 week of treatment.  SEEK IMMEDIATE MEDICAL CARE IF:  You develop an increased fever or chills.   You have chest pain.   You have severe shortness of breath.  You have bloody sputum.   You develop dehydration.  You faint or repeatedly feel like you are going to pass out.  You develop repeated vomiting.  You develop a severe headache. MAKE SURE YOU:   Understand these instructions.  Will watch your condition.  Will get help right away if you are not doing well or get worse. Document Released: 12/31/2004 Document Revised: 04/09/2014 Document Reviewed: 05/16/2013 [facility] Patient Information 2015 [place], [place]. This information is not intended to replace advice given to you by your health care provider. Make sure you discuss any questions you have with your health care provider.   Chest Pain (Nonspecific) It is often hard to give a diagnosis for the cause of chest pain. There is always a  chance that your pain could be related to something serious, such as a heart attack or a blood clot in the lungs. You need to follow up with your doctor. HOME CARE  If antibiotic medicine was given, take it as directed by your doctor. Finish the medicine even if you start to feel better.  For the  next few days, avoid activities that bring on chest pain. Continue physical activities as told by your doctor.  Do not use any tobacco products. This includes cigarettes, chewing tobacco, and e-cigarettes.  Avoid drinking alcohol.  Only take medicine as told by your doctor.  Follow your doctor's suggestions for more testing if your chest pain does not go away.  Keep all doctor visits you made. GET HELP IF:  Your chest pain does not go away, even after treatment.  You have a rash with blisters on your chest.  You have a fever. GET HELP RIGHT AWAY IF:   You have more pain or pain that spreads to your arm, neck, jaw, back, or belly (abdomen).  You have shortness of breath.  You cough more than usual or cough up blood.  You have very bad back or belly pain.  You feel sick to your stomach (nauseous) or throw up (vomit).  You have very bad weakness.  You pass out (faint).  You have chills. This is an emergency. Do not wait to see if the problems will go away. Call your local emergency services (911 in [place] Do not drive yourself to the hospital. MAKE SURE YOU:   Understand these instructions.  Will watch your condition.  Will get help right away if you are not doing well or get worse. Document Released: 05/11/2008 Document Revised: 11/28/2013 Document Reviewed: 05/11/2008 [facility] Patient Information 2015 [place], [place]. This information is not intended to replace advice given to you by your health care provider. Make sure you discuss any questions you have with your health care provider.

## 2015-06-21 NOTE — Discharge Summary (Signed)
Physician Discharge Summary   Patient ID: Mark Horton MRN: [id number] DOB/AGE: [birth year] 49 y.o.  Admit date: 06/20/2015 Discharge date: 06/21/2015  Primary Care Physician:  No PCP Per Patient  Discharge Diagnoses:     Atypical chest pain    Acute bronchitis  . Tobacco abuse . Essential hypertension . 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 Rennis Golden   Recommendations for Outpatient Follow-up:  Patient is recommended to follow-up with cardiology in 2 weeks otherwise stable  TESTS THAT NEED FOLLOW-UP None   DIET: Heart healthy diet    Allergies:  No Known Allergies   Discharge Medications:   Medication List    TAKE these medications        acetaminophen 325 MG tablet  Commonly known as:  TYLENOL  Take 650 mg by mouth every 6 (six) hours as needed for mild pain or moderate pain.     albuterol 108 (90 BASE) MCG/ACT inhaler  Commonly known as:  PROVENTIL HFA;VENTOLIN HFA  Inhale 2 puffs into the lungs every 6 (six) hours as needed for wheezing or shortness of breath.     aspirin 81 MG EC tablet  Take 1 tablet (81 mg total) by mouth daily.     atorvastatin 10 MG tablet  Commonly known as:  LIPITOR  Take 20 mg by mouth daily at 6 PM.     carvedilol 6.25 MG tablet  Commonly known as:  COREG  Take 2 tablets (12.5 mg total) by mouth 2 (two) times daily with a meal.     doxycycline 100 MG tablet  Commonly known as:  VIBRA-TABS  Take 1 tablet (100 mg total) by mouth 2 (two) times daily.     furosemide 40 MG tablet  Commonly known as:  LASIX  Take 2 tablets (80 mg total) by mouth 2 (two) times daily.     hydrALAZINE 25 MG tablet  Commonly known as:  APRESOLINE  Take 37.5 mg by mouth 2 times daily at 12 noon and 4 pm.     isosorbide-hydrALAZINE 20-37.5 MG per tablet  Commonly known as:  BIDIL  Take 1 tablet by mouth 2 (two) times daily.     lisinopril 40 MG tablet  Commonly known as:  PRINIVIL,ZESTRIL  Take 30 mg by mouth  daily.     omeprazole 20 MG capsule  Commonly known as:  PRILOSEC  Take 20 mg by mouth daily.     predniSONE 10 MG tablet  Commonly known as:  DELTASONE  Prednisone dosing: Take  Prednisone 40mg  (4 tabs) x 3 days, then taper to 30mg  (3 tabs) x 3 days, then 20mg  (2 tabs) x 3days, then 10mg  (1 tab) x 3days, then OFF.  Dispense:  30 tabs, refills: None     traMADol 50 MG tablet  Commonly known as:  ULTRAM  Take 1 tablet (50 mg total) by mouth every 6 (six) hours as needed for moderate pain.         Brief H and P: For complete details please refer to admission H and P, but in brief patient is a 49 year old male with  hypertension, systolic CHF, EF 80%, nonischemic cardiomyopathy, tobacco abuse, presented with chest pain. Patient reported about 3 day history of chest pain with pain in the lower back and intermittent shortness of breath. He also reported pain behind his right shoulder and worsened with bending and turning, worse with coughing and deep breathing. He continues to smoke. Patient was admitted for further workup.  Hospital Course:  Atypical chest pain: Worse with deep breathing, and musculoskeletal Patient was ruled out for acute ACS, troponins negative, chest x-ray negative for pneumonia however did have bronchitis. CT angiogram of the chest was negative for pulmonary embolism. 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 Patient had a previous cardiac cath in May 2016 which had shown 40% blockage in LAD. Patient's chest pain significant improved after Solu-Medrol, breathing treatment doxycycline. Cardiology was consulted and recommended no ischemia workup at this time.  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 He will continue doxycycline for 7 days and prednisone with taper, albuterol as needed  Essential hypertension Currently stable, continue Coreg, lisinopril, isosorbide  and hydralazine  Back pain Currently improving, placed on tramadol for back pain. Patient received ibuprofen and Toradol inpatient.  Nonischemic cardiomyopathy EF 30-35% with grade 2 diastolic dysfunction Currently stable, continue Lasix,Coreg, lisinopril, isosorbide and hydralazine  Tobacco abuse Patient was recommended to quit smoking  Day of Discharge BP 149/100 mmHg  Pulse 80  Temp(Src) 97.6 F (36.4 C) (Oral)  Resp 20  Ht  (1.778 m)  Wt 83.961 kg (185 lb 1.6 oz)  BMI 26.56 kg/m2  SpO2 100%  Physical Exam: General: Alert and awake oriented x3 not in any acute distress. HEENT: anicteric sclera, pupils reactive to light and accommodation CVS: S1-S2 clear no murmur rubs or gallops Chest: clear to auscultation bilaterally, no wheezing rales or rhonchi Abdomen: soft nontender, nondistended, normal bowel sounds Extremities: no cyanosis, clubbing or edema noted bilaterally Neuro: Cranial nerves II-XII intact, no focal neurological deficits   The results of significant diagnostics from this hospitalization (including imaging, microbiology, ancillary and laboratory) are listed below for reference.    LAB RESULTS: Basic Metabolic Panel:  Recent Labs Lab 06/20/15 1025 06/20/15 1819  NA 139  --   K 3.9  --   CL 105  --   CO2 26  --   GLUCOSE 102*  --   BUN 14  --   CREATININE 1.23 1.14  CALCIUM 8.6*  --    Liver Function Tests: No results for input(s): AST, ALT, ALKPHOS, BILITOT, PROT, ALBUMIN in the last 168 hours. No results for input(s): LIPASE, AMYLASE in the last 168 hours. No results for input(s): AMMONIA in the last 168 hours. CBC:  Recent Labs Lab 06/20/15 1025 06/20/15 1819  WBC 8.5 7.8  HGB 14.4 14.8  HCT 43.2 44.2  MCV 87.3 87.0  PLT 210 208   Cardiac Enzymes:  Recent Labs Lab 06/20/15 2144 06/21/15 0049  TROPONINI <0.03 <0.03   BNP: Invalid input(s): POCBNP CBG: No results for input(s): GLUCAP in the last 168 hours.  Significant  Diagnostic Studies:  Dg Chest 2 View  06/20/2015   CLINICAL DATA:  Shortness of breath, cough  EXAM: CHEST  2 VIEW  COMPARISON:  None.  FINDINGS: Mild bilateral interstitial thickening.  There is no focal parenchymal opacity. There is no pleural effusion or pneumothorax. The heart and mediastinal contours are unremarkable.  The osseous structures are unremarkable.  IMPRESSION: Mild bilateral interstitial thickening as can be seen with bronchitis versus atypical infection versus mild interstitial edema.   Electronically Signed   By: Elige Ko   On: 06/20/2015 10:40   Ct Angio Chest Pe W/cm &/or Wo Cm  06/20/2015   CLINICAL DATA:  Back pain and shortness of breath for 1 week  EXAM: CT ANGIOGRAPHY CHEST WITH CONTRAST  TECHNIQUE: Multidetector CT imaging of the chest was performed using the standard protocol during bolus administration of intravenous contrast. Multiplanar CT image reconstructions and MIPs were obtained to evaluate the vascular anatomy.  CONTRAST:  OMNIPAQUE IOHEXOL 350 MG/ML SOLN  COMPARISON:  Chest x-ray from earlier in the same day. The lungs are well aerated bilaterally. Mild emphysematous changes are noted in the apices. No focal confluent infiltrate is seen. Mild bronchial thickening is noted likely related to a degree of bronchitis.  The thoracic inlet is within normal limits. The pulmonary artery is well visualized and within normal limits without filling defect to suggest pulmonary embolism. Mild bilateral hilar lymph nodes are noted likely of reactive nature. These are most prominent in the right infrahilar region. Small mediastinal lymph nodes are noted. The largest of these mediastinal lymph nodes is noted in the article pulmonary window best seen on image number 102 of series 5. This measures approximately 12 mm in short axis. No right heart strain is noted. Coronary calcifications are seen.  The thoracic aorta demonstrates mild atherosclerotic calcifications without aneurysmal  dilatation. Scanning into the upper abdomen reveals no acute abnormality. The osseous structures are grossly unremarkable.  FINDINGS: Mild emphysematous changes.  Peribronchial thickening and likely reactive hilar lymph nodes consistent with bronchitis. No focal confluent infiltrate is seen.  No evidence of pulmonary embolism.  No other focal abnormality is noted.  Review of the MIP images confirms the above findings.   Electronically Signed   By: Alcide Clever M.D.   On: 06/20/2015 12:37    2D ECHO:   Disposition and Follow-up:     Discharge Instructions    (HEART FAILURE PATIENTS) Call MD:  Anytime you have any of the following symptoms: 1) 3 pound weight gain in 24 hours or 5 pounds in 1 week 2) shortness of breath, with or without a dry hacking cough 3) swelling in the hands, feet or stomach 4) if you have to sleep on extra pillows at night in order to breathe.    Complete by:  As directed      Diet - low sodium heart healthy    Complete by:  As directed      Discharge instructions    Complete by:  As directed   Please take omeprazole twice a day while you are on prednisone.  Continue all your cardiac medications and follow-up with your cardiologist in 2 weeks.     Increase activity slowly    Complete by:  As directed             DISPOSITION: *Home**   DISCHARGE FOLLOW-UP Follow-up Information    Follow up with Olga Millers, MD. Schedule an appointment as soon as possible for a visit in 2 weeks.   Specialty:  Cardiology   Why:  for hospital follow-up   Contact information:   [address] [place] [address] [phone]        Time spent on Discharge: 30 mins   Signed:   Harinder Romas M.D. [facility] 06/21/2015, 10:59 AM Pager: [phone]

## 2015-06-24 ENCOUNTER — Telehealth: Payer: Self-pay | Admitting: Cardiology

## 2015-06-25 NOTE — Telephone Encounter (Signed)
Closed encounter °

## 2016-04-20 IMAGING — CT CT ANGIO CHEST
2 of 6 series · 19 of 36 positions shown · IV contrast (APPLIED)
Comparison: Chest x-ray from earlier in the same day.

CLINICAL DATA: Back pain and shortness of breath for 1 week

EXAM:
CT ANGIOGRAPHY CHEST WITH CONTRAST
TECHNIQUE: Multidetector CT imaging of the chest was performed using the
standard protocol during bolus administration of intravenous
contrast. Multiplanar CT image reconstructions and MIPs were
obtained to evaluate the vascular anatomy.
CONTRAST:  100mL OMNIPAQUE IOHEXOL 350 MG/ML SOLN

[Series 5: pe 1.0 b26f · axial · 0.66mm/px · z∈[-192,+58]mm · 18 of 278 slices shown]
[im 14/278  lung]
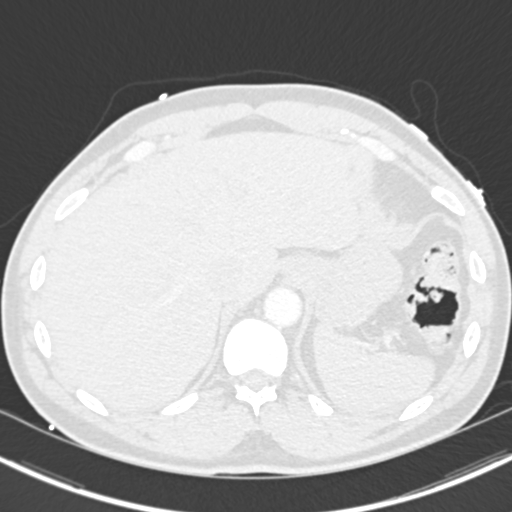
[im 28/278  mediastinal]
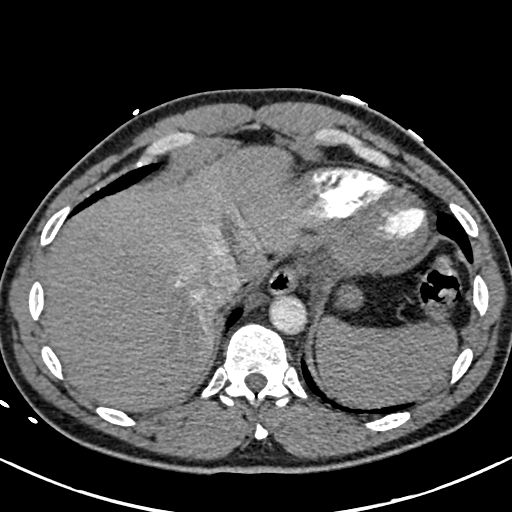
[im 42/278  lung]
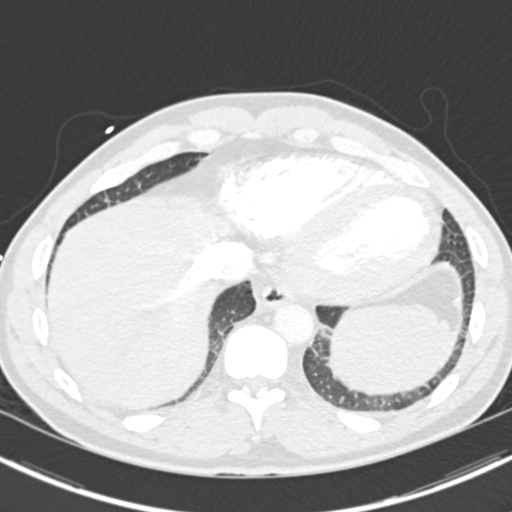
[im 56/278  mediastinal]
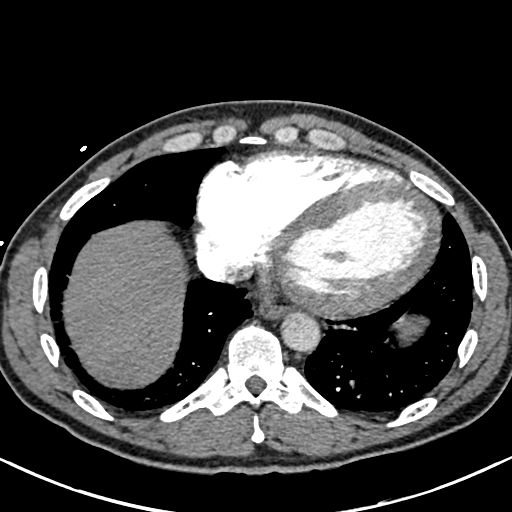
[im 70/278  lung]
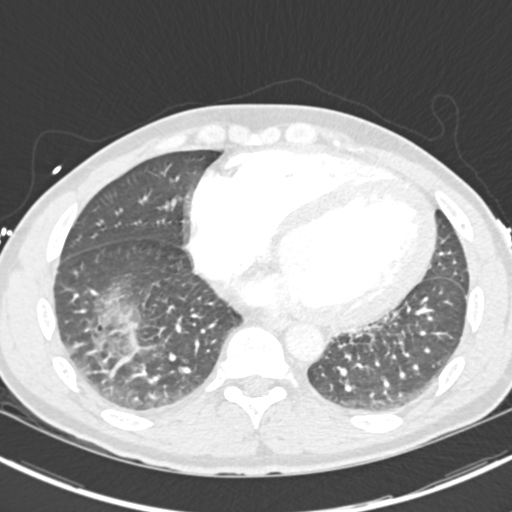
[im 84/278  mediastinal]
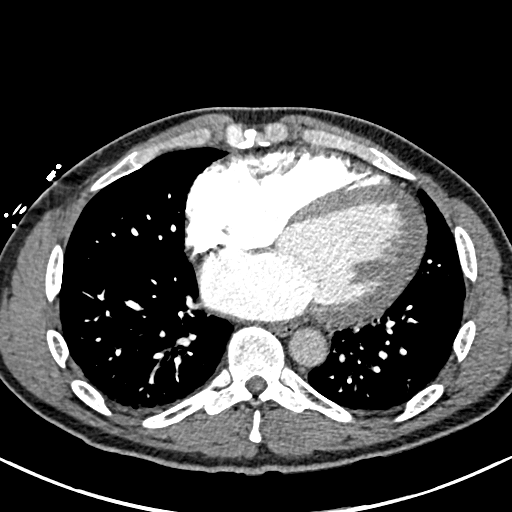
[im 97/278  lung]
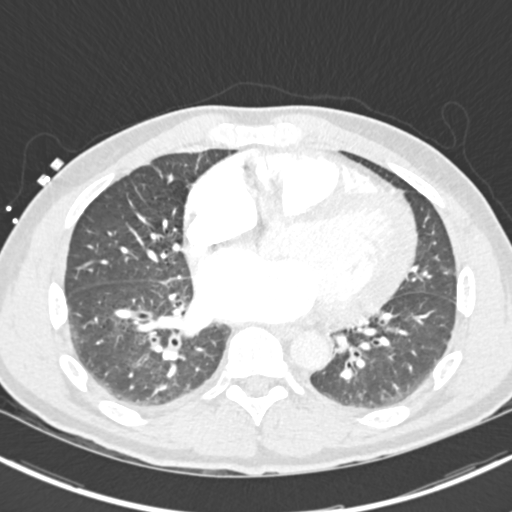
[im 111/278  mediastinal]
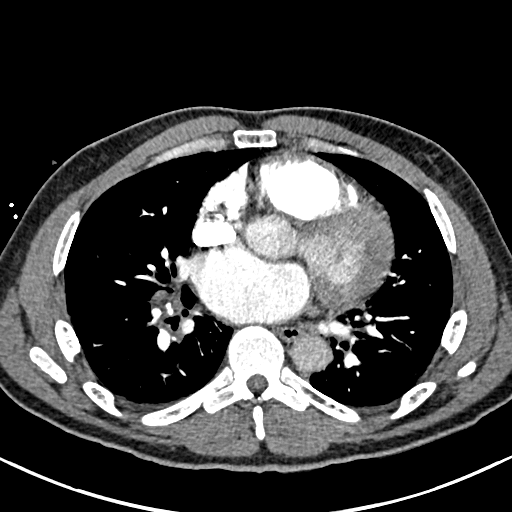
[im 125/278  lung]
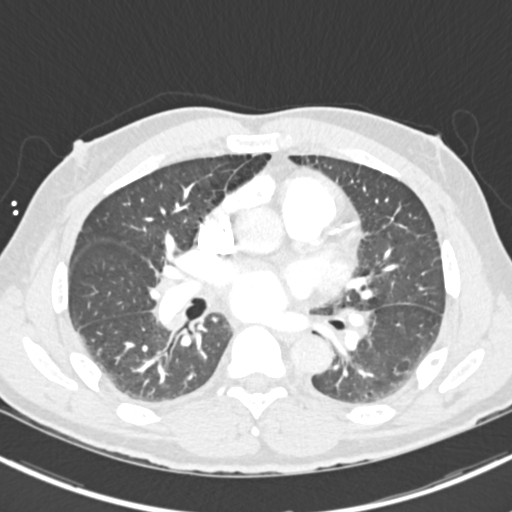
[im 153/278  mediastinal]
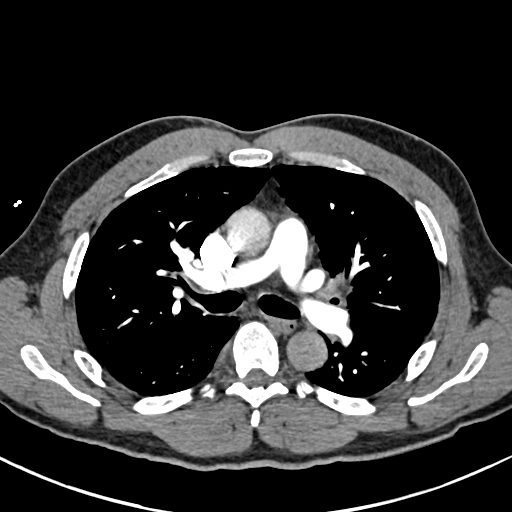
[im 167/278  lung]
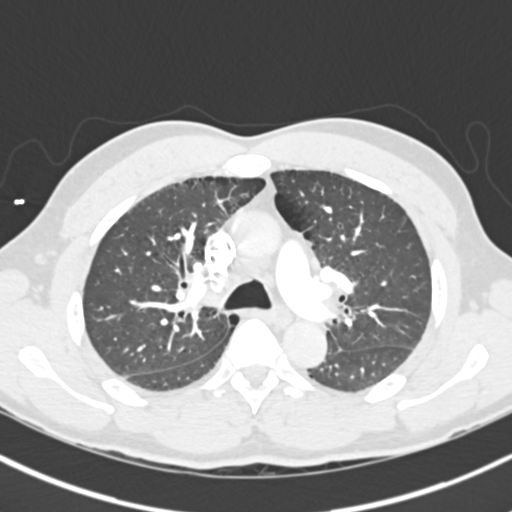
[im 181/278  mediastinal]
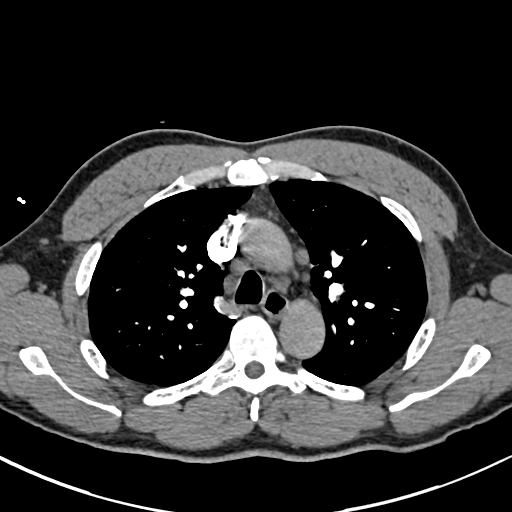
[im 194/278  lung]
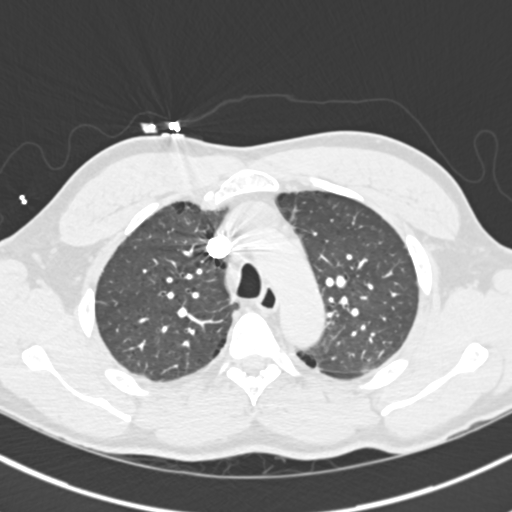
[im 208/278  mediastinal]
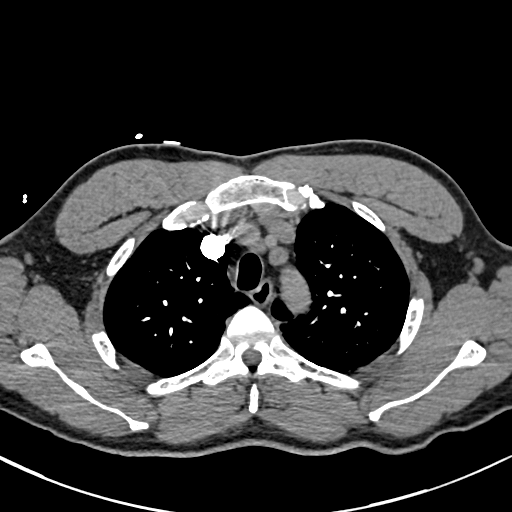
[im 222/278  lung]
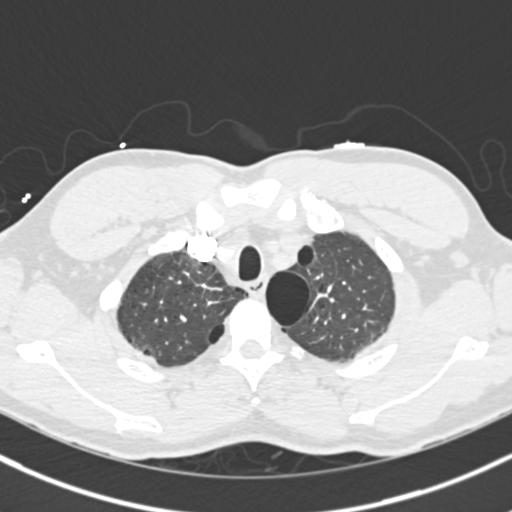
[im 236/278  mediastinal]
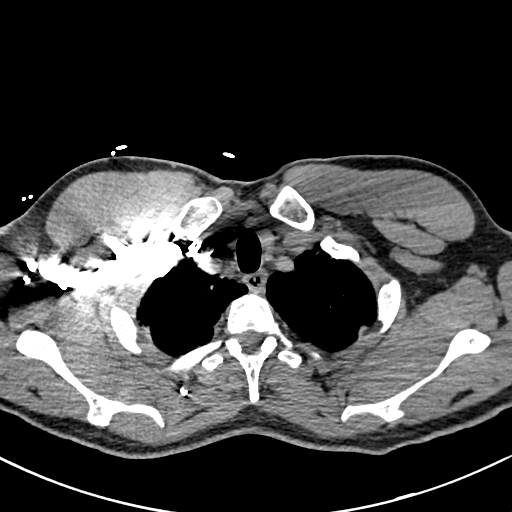
[im 250/278  lung]
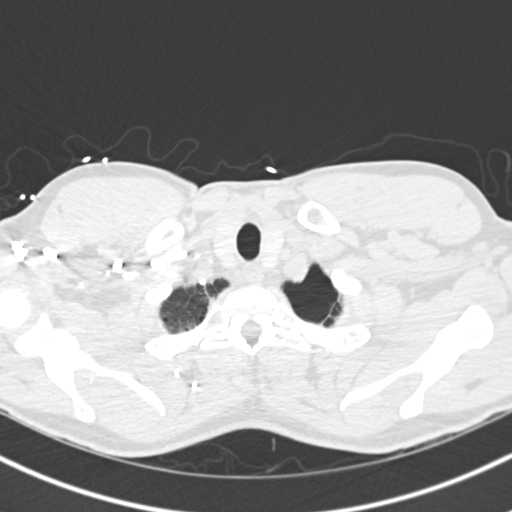
[im 264/278  mediastinal]
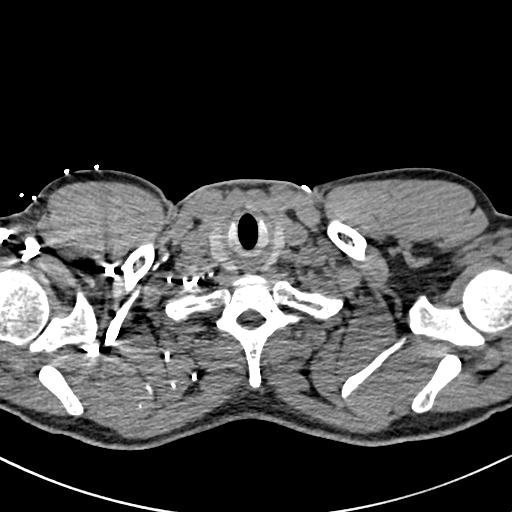

[Series 8: pe 2.0 coronal · coronal · 0.66mm/px · 1 of 129 slices shown]
[im 65/129  mediastinal]
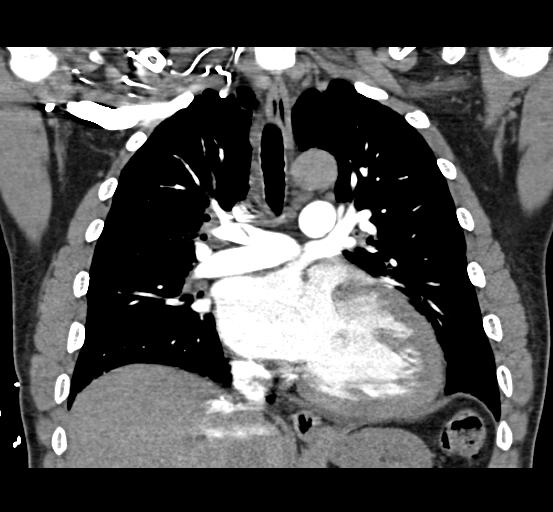

[19 of 36 positions shown; findings below may reference images not displayed]

The lungs are
well aerated bilaterally. Mild emphysematous changes are noted in
the apices. No focal confluent infiltrate is seen. Mild bronchial
thickening is noted likely related to a degree of bronchitis.

The thoracic inlet is within normal limits. The pulmonary artery is
well visualized and within normal limits without filling defect to
suggest pulmonary embolism. Mild bilateral hilar lymph nodes are
noted likely of reactive nature. These are most prominent in the
right infrahilar region. Small mediastinal lymph nodes are noted.
The largest of these mediastinal lymph nodes is noted in the article
pulmonary window best seen on image number 102 of series 5. This
measures approximately 12 mm in short axis. No right heart strain is
noted. Coronary calcifications are seen.

The thoracic aorta demonstrates mild atherosclerotic calcifications
without aneurysmal dilatation. Scanning into the upper abdomen
reveals no acute abnormality. The osseous structures are grossly
unremarkable.
FINDINGS: Mild emphysematous changes.

Peribronchial thickening and likely reactive hilar lymph nodes
consistent with bronchitis. No focal confluent infiltrate is seen.

No evidence of pulmonary embolism.

No other focal abnormality is noted.

Review of the MIP images confirms the above findings.

## 2023-04-07 DEATH — deceased
